# Patient Record
Sex: Female | Born: 1970 | Race: White | Hispanic: No | Marital: Married | Smoking: Never smoker
Health system: Southern US, Community
[De-identification: ages and names within clinical notes are randomized; demographics above are authoritative.]

## PROBLEM LIST (undated history)

## (undated) DIAGNOSIS — M109 Gout, unspecified: Secondary | ICD-10-CM

## (undated) DIAGNOSIS — E559 Vitamin D deficiency, unspecified: Secondary | ICD-10-CM

## (undated) DIAGNOSIS — N870 Mild cervical dysplasia: Secondary | ICD-10-CM

## (undated) DIAGNOSIS — Z9889 Other specified postprocedural states: Secondary | ICD-10-CM

## (undated) DIAGNOSIS — I10 Essential (primary) hypertension: Secondary | ICD-10-CM

## (undated) DIAGNOSIS — K219 Gastro-esophageal reflux disease without esophagitis: Secondary | ICD-10-CM

## (undated) DIAGNOSIS — Z87442 Personal history of urinary calculi: Secondary | ICD-10-CM

## (undated) DIAGNOSIS — T4145XA Adverse effect of unspecified anesthetic, initial encounter: Secondary | ICD-10-CM

## (undated) DIAGNOSIS — N920 Excessive and frequent menstruation with regular cycle: Secondary | ICD-10-CM

## (undated) DIAGNOSIS — R112 Nausea with vomiting, unspecified: Secondary | ICD-10-CM

## (undated) DIAGNOSIS — T8859XA Other complications of anesthesia, initial encounter: Secondary | ICD-10-CM

## (undated) HISTORY — DX: Mild cervical dysplasia: N87.0

## (undated) HISTORY — PX: OTHER SURGICAL HISTORY: SHX169

## (undated) HISTORY — DX: Vitamin D deficiency, unspecified: E55.9

## (undated) HISTORY — DX: Gout, unspecified: M10.9

## (undated) HISTORY — PX: TONSILLECTOMY: SUR1361

## (undated) HISTORY — DX: Excessive and frequent menstruation with regular cycle: N92.0

---
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
## 1995-12-12 HISTORY — PX: CERVICAL BIOPSY  W/ LOOP ELECTRODE EXCISION: SUR135

## 1996-10-09 HISTORY — PX: COLPOSCOPY: SHX161

## 2006-09-06 ENCOUNTER — Ambulatory Visit: Payer: Self-pay | Admitting: Family Medicine

## 2010-03-02 ENCOUNTER — Ambulatory Visit: Payer: Self-pay | Admitting: Family Medicine

## 2011-11-08 HISTORY — PX: DILATION AND CURETTAGE OF UTERUS: SHX78

## 2011-11-24 ENCOUNTER — Ambulatory Visit: Payer: Self-pay | Admitting: Obstetrics and Gynecology

## 2011-11-24 LAB — COMPREHENSIVE METABOLIC PANEL
Albumin: 4 g/dL (ref 3.4–5.0)
Anion Gap: 11 (ref 7–16)
BUN: 11 mg/dL (ref 7–18)
Bilirubin,Total: 1 mg/dL (ref 0.2–1.0)
Chloride: 105 mmol/L (ref 98–107)
Co2: 24 mmol/L (ref 21–32)
Creatinine: 0.72 mg/dL (ref 0.60–1.30)
EGFR (African American): >60
Glucose: 81 mg/dL (ref 65–99)
Potassium: 4.1 mmol/L (ref 3.5–5.1)
SGOT(AST): 18 U/L (ref 15–37)
Total Protein: 8 g/dL (ref 6.4–8.2)

## 2011-11-24 LAB — CBC
MCH: 30.1 pg (ref 26.0–34.0)
MCV: 91 fL (ref 80–100)
Platelet: 362 10*3/uL (ref 150–440)
RBC: 4.15 10*6/uL (ref 3.80–5.20)
RDW: 13.9 % (ref 11.5–14.5)

## 2011-12-02 ENCOUNTER — Ambulatory Visit: Payer: Self-pay | Admitting: Obstetrics and Gynecology

## 2015-03-01 NOTE — Op Note (Signed)
PATIENT NAME:  Amanda Ibarra, Amanda Ibarra MR#:  [id number] DATE OF BIRTH:  Dec 16, 1970  DATE OF PROCEDURE:  12/02/2011  PREOPERATIVE DIAGNOSES:  1. Menorrhagia.  2. Suspected endometrial polyp.   POSTOPERATIVE DIAGNOSES:  1. Menorrhagia. 2. Suspected endometrial polyp.  PROCEDURES:  1. Dilation and curettage.  2. Hysteroscopy.  3. Polypectomy.   SURGEON: Prentice Docker, MD  ANESTHESIA: General.   ESTIMATED BLOOD LOSS: 100 mL.  OPERATIVE FLUIDS: 1000 mL crystalloid.   COMPLICATIONS: None.   FINDINGS:  1. Retroverted uterus, sound to 8 cm.  2. Polypoid lesion, approximately 2 cm in length, base apparently from left sidewall of the uterus.  3. Otherwise normal-appearing uterine cavity.   SPECIMENS: Endometrial curettings and suspected polyp.   CONDITION: Stable.   INDICATIONS FOR PROCEDURE: Amanda Ibarra is a 44 year old female who was seen initially by her provider at [facility] with complaints of very heavy regular periods. In that work-up, which consisted of an ultrasound, it was noted that she had what appeared to be a likely polyp in her endometrium. For this, she was referred to me for potential treatment and diagnosis options. After discussion with the patient and her husband, she elected to undergo a hysteroscopy, and dilation and curettage, and potential removal of polyp as both diagnostic and therapeutic treatment. She was, therefore, taken to the Operating Room.   PROCEDURE IN DETAIL: The patient was met in the preoperative area where the procedure was reviewed and questions were answered. She was taken to the Operating Room and placed under general anesthesia, which was found to be adequate. She was placed in the dorsal supine lithotomy position in candy cane stirrups. She was prepped and draped in the usual sterile fashion. A timeout was called, and a straight catheter was then used with zero urine returned. A sterile speculum was placed in the vagina, and the anterior lip of  the cervix was grasped with a single-tooth tenaculum. The uterus was sounded to a depth of 8 cm. The cervix was then dilated in a serial fashion using Hegar dilators to 8 mm. The hysteroscope was gently introduced through the cervical os until it entered into the endometrial cavity with the above-noted findings. After the hysteroscope was removed, the polyp was attempted to be removed with Randall stone forceps, though unsuccessfully. Next, a gentle curettage of the uterine lining was performed until a gritty texture was noted throughout. The hysteroscope was then reintroduced. There was a fair amount of bleeding, and visualization was poor; so a repeat curettage was performed, and the hysteroscope was then reintroduced with improved visualization and noted absence of the aforementioned polyp. At this point, the procedure was terminated. The hysteroscope was removed from the uterus. The tenaculum was removed from the anterior lip of the cervix and hemostasis was obtained. The speculum was removed from the vagina; and, prior to this, the vagina was verified to be free of any sponges or instruments.   The patient tolerated the procedure well. Sponge, lap, and instrument counts were correct x2. The patient was awakened in the Operating Room and taken to the PACU in stable condition.   ____________________________ Will Bonnet, MD sdj:cbb D: 12/02/2011 17:46:48 ET T: 12/03/2011 08:25:33 ET JOB#: [id number]  cc: Will Bonnet, MD, <Dictator> Will Bonnet MD ELECTRONICALLY SIGNED 12/18/2011 3:05

## 2015-03-30 ENCOUNTER — Emergency Department
Admission: EM | Admit: 2015-03-30 | Discharge: 2015-03-30 | Disposition: A | Discharge status: Home / Self Care | Payer: BLUE CROSS/BLUE SHIELD | Attending: Emergency Medicine | Admitting: Emergency Medicine

## 2015-03-30 ENCOUNTER — Emergency Department: Payer: BLUE CROSS/BLUE SHIELD

## 2015-03-30 DIAGNOSIS — M62830 Muscle spasm of back: Secondary | ICD-10-CM | POA: Insufficient documentation

## 2015-03-30 DIAGNOSIS — Z880 Allergy status to penicillin: Secondary | ICD-10-CM | POA: Insufficient documentation

## 2015-03-30 DIAGNOSIS — Z3202 Encounter for pregnancy test, result negative: Secondary | ICD-10-CM | POA: Diagnosis not present

## 2015-03-30 DIAGNOSIS — Z79899 Other long term (current) drug therapy: Secondary | ICD-10-CM | POA: Diagnosis not present

## 2015-03-30 DIAGNOSIS — N23 Unspecified renal colic: Secondary | ICD-10-CM | POA: Insufficient documentation

## 2015-03-30 DIAGNOSIS — R109 Unspecified abdominal pain: Secondary | ICD-10-CM | POA: Diagnosis present

## 2015-03-30 DIAGNOSIS — I10 Essential (primary) hypertension: Secondary | ICD-10-CM | POA: Diagnosis not present

## 2015-03-30 HISTORY — DX: Essential (primary) hypertension: I10

## 2015-03-30 LAB — URINALYSIS COMPLETE WITH MICROSCOPIC (ARMC ONLY)
Bacteria, UA: NONE SEEN
Bilirubin Urine: NEGATIVE
Glucose, UA: NEGATIVE mg/dL
Ketones, ur: NEGATIVE mg/dL
LEUKOCYTES UA: NEGATIVE
Nitrite: NEGATIVE
PH: 6 (ref 5.0–8.0)
Protein, ur: NEGATIVE mg/dL
Specific Gravity, Urine: 1.013 (ref 1.005–1.030)

## 2015-03-30 LAB — CBC WITH DIFFERENTIAL/PLATELET
BASOS PCT: 1 %
Basophils Absolute: 0.1 10*3/uL (ref 0–0.1)
EOS ABS: 0 10*3/uL (ref 0–0.7)
Eosinophils Relative: 0 %
HEMATOCRIT: 45.1 % (ref 35.0–47.0)
Hemoglobin: 15.1 g/dL (ref 12.0–16.0)
LYMPHS ABS: 1.7 10*3/uL (ref 1.0–3.6)
LYMPHS PCT: 16 %
MCH: 30.3 pg (ref 26.0–34.0)
MCHC: 33.5 g/dL (ref 32.0–36.0)
MCV: 90.5 fL (ref 80.0–100.0)
MONO ABS: 0.4 10*3/uL (ref 0.2–0.9)
Monocytes Relative: 4 %
NEUTROS ABS: 8.4 10*3/uL — AB (ref 1.4–6.5)
NEUTROS PCT: 79 %
Platelets: 327 10*3/uL (ref 150–440)
RBC: 4.99 MIL/uL (ref 3.80–5.20)
RDW: 12.9 % (ref 11.5–14.5)
WBC: 10.6 10*3/uL (ref 3.6–11.0)

## 2015-03-30 LAB — BASIC METABOLIC PANEL
ANION GAP: 12 (ref 5–15)
BUN: 11 mg/dL (ref 6–20)
CO2: 22 mmol/L (ref 22–32)
Calcium: 9.6 mg/dL (ref 8.9–10.3)
Chloride: 102 mmol/L (ref 101–111)
Creatinine, Ser: 0.77 mg/dL (ref 0.44–1.00)
GFR calc Af Amer: >60 mL/min (ref >60)
GFR calc non Af Amer: >60 mL/min (ref >60)
Glucose, Bld: 107 mg/dL — ABNORMAL HIGH (ref 65–99)
POTASSIUM: 3.8 mmol/L (ref 3.5–5.1)
Sodium: 136 mmol/L (ref 135–145)

## 2015-03-30 LAB — POCT PREGNANCY, URINE: Preg Test, Ur: NEGATIVE

## 2015-03-30 MED ORDER — OXYCODONE-ACETAMINOPHEN 5-325 MG PO TABS
1.0000 | ORAL_TABLET | Freq: Once | ORAL | Status: AC
Start: 2015-03-30 — End: 2015-03-30
  Administered 2015-03-30: 1 via ORAL

## 2015-03-30 MED ORDER — OXYCODONE-ACETAMINOPHEN 5-325 MG PO TABS
ORAL_TABLET | ORAL | Status: AC
  Administered 2015-03-30: 1 via ORAL
  Filled 2015-03-30: qty 1

## 2015-03-30 MED ORDER — KETOROLAC TROMETHAMINE 30 MG/ML IJ SOLN
INTRAMUSCULAR | Status: AC
  Filled 2015-03-30: qty 1

## 2015-03-30 MED ORDER — OXYCODONE-ACETAMINOPHEN 5-325 MG PO TABS: 1.0000 | ORAL_TABLET | Freq: Four times a day (QID) | ORAL | Status: DC | PRN

## 2015-03-30 MED ORDER — OXYCODONE-ACETAMINOPHEN 5-325 MG PO TABS
1.0000 | ORAL_TABLET | Freq: Once | ORAL | Status: AC
  Administered 2015-03-30: 1 via ORAL

## 2015-03-30 MED ORDER — OXYCODONE-ACETAMINOPHEN 5-325 MG PO TABS
ORAL_TABLET | ORAL | Status: AC
  Filled 2015-03-30: qty 1

## 2015-03-30 MED ORDER — KETOROLAC TROMETHAMINE 30 MG/ML IJ SOLN: 30.0000 mg | Freq: Once | INTRAMUSCULAR | Status: DC

## 2015-03-30 MED ORDER — KETOROLAC TROMETHAMINE 60 MG/2ML IM SOLN
60.0000 mg | Freq: Once | INTRAMUSCULAR | Status: AC
  Administered 2015-03-30: 60 mg via INTRAMUSCULAR

## 2015-03-30 MED ORDER — KETOROLAC TROMETHAMINE 60 MG/2ML IM SOLN
INTRAMUSCULAR | Status: AC
  Administered 2015-03-30: 60 mg via INTRAMUSCULAR
  Filled 2015-03-30: qty 2

## 2015-03-30 MED ORDER — KETOROLAC TROMETHAMINE 30 MG/ML IJ SOLN
15.0000 mg | Freq: Once | INTRAMUSCULAR | Status: AC
  Administered 2015-03-30: 15 mg via INTRAMUSCULAR

## 2015-03-30 NOTE — Discharge Instructions (Signed)

## 2015-03-30 NOTE — ED Provider Notes (Signed)
[facility] Emergency Department Provider Note  ____________________________________________  Time seen: 1455  I have reviewed the triage vital signs and the nursing notes.   HISTORY  Chief Complaint Flank Pain  left side    HPI Amanda Ibarra is a 44 y.o. female  who experienced acute onset flank pain on the left this morning. It occurred well she was driving. The pain has been moderately severe. She does not have a history of prior kidney stones but she is concerned that that may be the cause. Her significant other has had 6 kidney stones. She came to the emergency Department and was given a Percocet while waiting. This helped reduce the pain but it is now coming back. She denies any fevers. She has no diarrhea, nausea or vomiting, or other GI symptoms.     Past Medical History  Diagnosis Date  . Hypertension     There are no active problems to display for this patient.   Past Surgical History  Procedure Laterality Date  . Tonsillectomy      Current Outpatient Rx  Name  Route  Sig  Dispense  Refill  . calcium-vitamin D (OSCAL WITH D) 500-200 MG-UNIT per tablet   Oral   Take 1 tablet by mouth.         . metoprolol succinate (TOPROL-XL) 25 MG 24 hr tablet   Oral   Take 25 mg by mouth daily.         Marland Kitchen oxyCODONE-acetaminophen (ROXICET) 5-325 MG per tablet   Oral   Take 1 tablet by mouth every 6 (six) hours as needed.   20 tablet   0     Allergies Codeine and Penicillins  No family history on file.  Social History History  Substance Use Topics  . Smoking status: Never Smoker   . Smokeless tobacco: Never Used  . Alcohol Use: No    Review of Systems  Constitutional: Negative for fever. ENT: Negative for sore throat. Cardiovascular: Negative for chest pain. Respiratory: Negative for shortness of breath. Gastrointestinal: Flank pain see history of present illness Genitourinary: Flank pain see history of present illness  Musculoskeletal: Negative for back pain. Skin: Negative for rash. Neurological: Negative for headaches   10-point ROS otherwise negative.  ____________________________________________   PHYSICAL EXAM:  VITAL SIGNS: ED Triage Vitals  Enc Vitals Group     BP 03/30/15 1222 139/85 mmHg     Pulse Rate 03/30/15 1222 80     Resp 03/30/15 1222 18     Temp 03/30/15 1222 98.6 F (37 C)     Temp Source 03/30/15 1222 Oral     SpO2 03/30/15 1222 100 %     Weight 03/30/15 1222 239 lb (108.41 kg)     Height 03/30/15 1222 5\' 8"  (1.727 m)     Head Cir --      Peak Flow --      Pain Score 03/30/15 1223 6     Pain Loc --      Pain Edu? --      Excl. in [place]? --     Constitutional:  Alert and oriented. Appears to be moderately uncomfortable with left-sided flank pain. ENT   Head: Normocephalic and atraumatic.   Nose: No congestion/rhinnorhea.   Mouth/Throat: Mucous membranes are moist.      Ears: Cardiovascular: Normal rate, regular rhythm. Respiratory: Normal respiratory effort without tachypnea. Breath sounds are clear and equal bilaterally. No wheezes/rales/rhonchi. Gastrointestinal: Soft with mild tenderness on the left.  Patient and reports that it is really matter if one pushes on her belly or not it continues to hurt.  Back: Some degree of protective muscle spasm on the left. Positive CVA tenderness on the left Musculoskeletal: Nontender with normal range of motion in all extremities.  No noted edema. Neurologic:  Normal speech and language. No gross focal neurologic deficits are appreciated.  Skin:  Skin is warm, dry. No rash noted. Psychiatric: Mood and affect are normal. Speech and behavior are normal.  ____________________________________________    LABS (pertinent positives/negatives)  Urinalysis shows red blood cells too numerous to count. Metabolic panel is normal with normal kidney function. CBC is normal with a white blood cell count of  10.6. ____________________________________________   RADIOLOGY  Ultrasound, renal:  IMPRESSION: 1. Mild left-sided hydronephrosis. No ureteral jet is demonstrated. 2. No right-sided hydronephrosis. There is a 2.2 cm diameter lower pole cyst. 3. No bladder abnormality is demonstrated.  ____________________________________________  ______________________________   INITIAL IMPRESSION / ASSESSMENT AND PLAN / ED COURSE  Patient was signs and symptoms of a ureter stone. We will get an ultrasound to confirm. We discussed the pros and cons of CT scan to evaluate. The patient does not want a CT scan of this time. We'll treat her pain with additional Percocet now and Toradol 60 mg IM.  1755: Ultrasound does show left hydroureter consistent with a renal stone. Patient is overall comfortable this time. We'll discharge her with prescription for Percocet and instructions to follow-up with urology. ____________________________________________   FINAL CLINICAL IMPRESSION(S) / ED DIAGNOSES  Final diagnoses:  Renal colic on left side      Ahmed Prima, MD 03/30/15 1757

## 2015-03-30 NOTE — ED Notes (Signed)
Pt c/o sudden onset left flank pain that started at 8am this morning .   Denies kidney stone hx

## 2015-04-03 LAB — TSH: TSH: 2.65 u[IU]/mL (ref 0.41–5.90)

## 2015-04-03 LAB — CBC AND DIFFERENTIAL
HCT: 41 % (ref 36–46)
HEMOGLOBIN: 13.8 g/dL (ref 12.0–16.0)
PLATELETS: 356 10*3/uL (ref 150–399)
WBC: 8.9 10^3/mL

## 2015-04-03 LAB — BASIC METABOLIC PANEL
BUN: 13 mg/dL (ref 4–21)
Creatinine: 1 mg/dL (ref 0.5–1.1)
GLUCOSE: 91 mg/dL
Potassium: 4.1 mmol/L (ref 3.4–5.3)
SODIUM: 140 mmol/L (ref 137–147)

## 2015-04-03 LAB — HEPATIC FUNCTION PANEL
ALT: 22 U/L (ref 7–35)
AST: 20 U/L (ref 13–35)

## 2015-04-10 ENCOUNTER — Ambulatory Visit: Payer: Self-pay | Admitting: Urology

## 2015-04-10 DIAGNOSIS — F4320 Adjustment disorder, unspecified: Secondary | ICD-10-CM | POA: Insufficient documentation

## 2015-04-10 DIAGNOSIS — N3289 Other specified disorders of bladder: Secondary | ICD-10-CM | POA: Insufficient documentation

## 2015-04-10 DIAGNOSIS — G44209 Tension-type headache, unspecified, not intractable: Secondary | ICD-10-CM | POA: Insufficient documentation

## 2015-04-10 DIAGNOSIS — I10 Essential (primary) hypertension: Secondary | ICD-10-CM | POA: Insufficient documentation

## 2015-04-10 DIAGNOSIS — E669 Obesity, unspecified: Secondary | ICD-10-CM | POA: Insufficient documentation

## 2015-04-10 DIAGNOSIS — J309 Allergic rhinitis, unspecified: Secondary | ICD-10-CM | POA: Insufficient documentation

## 2015-04-10 DIAGNOSIS — F419 Anxiety disorder, unspecified: Secondary | ICD-10-CM | POA: Insufficient documentation

## 2015-04-10 DIAGNOSIS — E559 Vitamin D deficiency, unspecified: Secondary | ICD-10-CM | POA: Insufficient documentation

## 2015-04-10 DIAGNOSIS — N1330 Unspecified hydronephrosis: Secondary | ICD-10-CM | POA: Insufficient documentation

## 2015-04-21 ENCOUNTER — Other Ambulatory Visit: Payer: Self-pay | Admitting: Family Medicine

## 2015-04-21 DIAGNOSIS — N200 Calculus of kidney: Secondary | ICD-10-CM

## 2015-04-21 MED ORDER — KETOROLAC TROMETHAMINE 10 MG PO TABS: 10.0000 mg | ORAL_TABLET | Freq: Four times a day (QID) | ORAL | Status: DC | PRN

## 2015-04-21 NOTE — Telephone Encounter (Signed)
Mr. Arnall advised as directed below.   Thanks,   -Mickel Baas

## 2015-04-21 NOTE — Telephone Encounter (Signed)
Sent rx. Please call if worsens or does not improve.  Make sure patient drinks plenty of fluids. Thanks.

## 2015-04-21 NOTE — Telephone Encounter (Signed)
Pt husband, Delfino Lovett called states pt is having a lot of pain and seems to have a kidney stone moving.  Pt husband is requesting a Rx for Ketorolac tromethamine.  [facility] [address].  [phone]

## 2015-05-07 ENCOUNTER — Encounter: Payer: Self-pay | Admitting: Family Medicine

## 2015-05-07 ENCOUNTER — Ambulatory Visit (INDEPENDENT_AMBULATORY_CARE_PROVIDER_SITE_OTHER): Payer: BLUE CROSS/BLUE SHIELD | Admitting: Family Medicine

## 2015-05-07 VITALS — BP 146/88 | HR 88 | Temp 98.3°F | Resp 16 | Ht 68.0 in | Wt 239.0 lb

## 2015-05-07 DIAGNOSIS — N1330 Unspecified hydronephrosis: Secondary | ICD-10-CM

## 2015-05-07 DIAGNOSIS — N200 Calculus of kidney: Secondary | ICD-10-CM | POA: Diagnosis not present

## 2015-05-07 LAB — POCT URINALYSIS DIPSTICK
Bilirubin, UA: NEGATIVE
Glucose, UA: NEGATIVE
KETONES UA: NEGATIVE
LEUKOCYTES UA: NEGATIVE
Nitrite, UA: NEGATIVE
PH UA: 7
Protein, UA: NEGATIVE
Spec Grav, UA: <=1.005
Urobilinogen, UA: 0.2

## 2015-05-07 NOTE — Progress Notes (Signed)
Subjective:    Patient ID: Amanda Ibarra, female    DOB: 05/20/1971, 44 y.o.   MRN: [id number]  HPI   Pt presents today for a follow up of kidney stones.  Pt was in the ED 03/30/2015 for flank pain and was diagnosed with a Kidney Stone.  She was prescribed Toradol, and Flomax.  She reports feeling much better now and is not having any pain or urinary symptoms.  She has stopped the Toradol, but is still taking Flomax daily.  Does not know if passed the stone. Did have a pressure time and went away.    Patient Active Problem List   Diagnosis Date Noted  . Kidney stones 05/07/2015  . 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 Allergic rhinitis 04/10/2015  . Anxiety 04/10/2015  . Hydronephrosis 04/10/2015  . BP (high blood pressure) 04/10/2015  . Adiposity 04/10/2015  . 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 Tension type headache 04/10/2015  . Avitaminosis D 04/10/2015   Family History  Problem Relation Age of Onset  . Bone cancer Maternal Grandmother   . Hypertension Father   . Heart attack Maternal Grandfather   . Gout Maternal Grandfather   . Arthritis/Rheumatoid Mother    History   Social History  . Marital Status: Married    Spouse Name: N/A  . Number of Children: 1  . Years of Education: Bachelors   Occupational History  .  [employer]    Full Time   Social History Main Topics  . Smoking status: Never Smoker   . Smokeless tobacco: Never Used  . Alcohol Use: No  . Drug Use: No  . Sexual Activity: Yes    Birth Control/ Protection: IUD   Other Topics Concern  . Not on file   Social History Narrative   Past Surgical History  Procedure Laterality Date  . Tonsillectomy    . Dilation and curettage of uterus  11/2011  . [place] teeth     Allergies  Allergen Reactions  . Azithromycin Other (See Comments)    Other reaction(s): Vomiting  . Codeine Other (See Comments)    unknown  . Penicillins Rash   Previous Medications   CHOLECALCIFEROL (VITAMIN D) 2000  UNITS TABLET    Take 2,000 Units by mouth daily.   KETOROLAC (TORADOL) 10 MG TABLET    Take 1 tablet (10 mg total) by mouth every 6 (six) hours as needed.   METOPROLOL SUCCINATE (TOPROL-XL) 25 MG 24 HR TABLET    Take 25 mg by mouth daily.   MULTIPLE VITAMIN PO    Take 1 tablet by mouth daily.   OXYCODONE-ACETAMINOPHEN (ROXICET) 5-325 MG PER TABLET    Take 1 tablet by mouth every 6 (six) hours as needed.   TAMSULOSIN (FLOMAX) 0.4 MG CAPS CAPSULE    TK 1 C PO D FOR 14 DAYS   BP 146/88 mmHg  Pulse 88  Temp(Src) 98.3 F (36.8 C) (Oral)  Resp 16  Ht 5\' 8"  (1.727 m)  Wt 239 lb (108.41 kg)  BMI 36.35 kg/m2   Review of Systems  Constitutional: Negative for chills, diaphoresis, activity change, appetite change, fatigue and unexpected weight change.  Gastrointestinal: Negative for nausea, vomiting, diarrhea, constipation, blood in stool, abdominal distention, anal bleeding and rectal pain.  Genitourinary: Negative for urgency, frequency, hematuria, decreased urine volume, vaginal bleeding, vaginal discharge, enuresis, difficulty urinating, genital sores, vaginal pain, menstrual problem and dyspareunia.   BP 146/88 mmHg  Pulse 88  Temp(Src) 98.3 F (36.8 C) (Oral)  Resp 16  Ht 5\' 8"  (1.727 m)  Wt 239 lb (108.41 kg)  BMI 36.35 kg/m2      Objective:   Physical Exam  Constitutional: She is oriented to person, place, and time. She appears well-developed and well-nourished.  Neurological: She is alert and oriented to person, place, and time.  Psychiatric: She has a normal mood and affect. Her behavior is normal. Judgment and thought content normal.          Assessment & Plan:   1. Kidney stones Still with small blood, will send for UA.  Establish care with Dr. Jacqlyn Larsen on Friday.   - POCT urinalysis dipstick - US Renal; Future  2. Hydronephrosis, unspecified hydronephrosis type Will schedule follow up ultrasound.  - US Renal; Future

## 2015-05-08 ENCOUNTER — Telehealth: Payer: Self-pay

## 2015-05-08 LAB — URINALYSIS, ROUTINE W REFLEX MICROSCOPIC
BILIRUBIN UA: NEGATIVE
GLUCOSE, UA: NEGATIVE
Ketones, UA: NEGATIVE
Leukocytes, UA: NEGATIVE
Nitrite, UA: NEGATIVE
PH UA: 6.5 (ref 5.0–7.5)
Protein, UA: NEGATIVE
RBC UA: NEGATIVE
Specific Gravity, UA: 1.009 (ref 1.005–1.030)
UUROB: 0.2 mg/dL (ref 0.2–1.0)

## 2015-05-08 NOTE — Telephone Encounter (Signed)
Advised pt of lab results. Pt verbally acknowledges understanding. Jeslyn Amsler Drozdowski, CMA   

## 2015-05-08 NOTE — Telephone Encounter (Signed)
-----   Message from Margarita Rana, MD sent at 05/08/2015  6:47 AM EDT ----- Urine ok. Thanks.

## 2015-05-12 ENCOUNTER — Ambulatory Visit
Admission: RE | Admit: 2015-05-12 | Discharge: 2015-05-12 | Disposition: A | Discharge status: Home / Self Care | Payer: BLUE CROSS/BLUE SHIELD | Source: Ambulatory Visit | Attending: Family Medicine | Admitting: Family Medicine

## 2015-05-12 DIAGNOSIS — N281 Cyst of kidney, acquired: Secondary | ICD-10-CM | POA: Insufficient documentation

## 2015-05-12 DIAGNOSIS — N1330 Unspecified hydronephrosis: Secondary | ICD-10-CM | POA: Diagnosis present

## 2015-05-12 DIAGNOSIS — N200 Calculus of kidney: Secondary | ICD-10-CM

## 2015-05-13 ENCOUNTER — Telehealth: Payer: Self-pay

## 2015-05-13 NOTE — Telephone Encounter (Signed)
Informed pt of results. Pt agrees with treatment plan. Renaldo Fiddler, CMA

## 2015-05-13 NOTE — Telephone Encounter (Signed)
-----   Message from Margarita Rana, MD sent at 05/12/2015  5:25 PM EDT ----- Ultrasound normal. No hydronephrosis. Resolved. Does have small renal cyst. Usually benign but would recommend recheck ultrasound in one year just to be on safe side. So small, radiologist did not even recommend follow up. Thanks.

## 2015-09-18 ENCOUNTER — Other Ambulatory Visit: Payer: Self-pay | Admitting: Family Medicine

## 2015-09-18 DIAGNOSIS — I10 Essential (primary) hypertension: Secondary | ICD-10-CM

## 2015-11-10 DIAGNOSIS — Z87442 Personal history of urinary calculi: Secondary | ICD-10-CM | POA: Insufficient documentation

## 2016-01-08 ENCOUNTER — Telehealth: Payer: Self-pay | Admitting: Family Medicine

## 2016-01-08 NOTE — Telephone Encounter (Signed)
Husband requested patch for cruise, but not until May, so will call back. Thanks.

## 2016-01-18 ENCOUNTER — Encounter: Payer: Self-pay | Admitting: Family Medicine

## 2016-01-18 ENCOUNTER — Ambulatory Visit (INDEPENDENT_AMBULATORY_CARE_PROVIDER_SITE_OTHER): Payer: BLUE CROSS/BLUE SHIELD | Admitting: Family Medicine

## 2016-01-18 VITALS — BP 146/80 | HR 96 | Temp 99.0°F | Resp 16 | Ht 68.0 in | Wt 232.0 lb

## 2016-01-18 DIAGNOSIS — I10 Essential (primary) hypertension: Secondary | ICD-10-CM

## 2016-01-18 DIAGNOSIS — N3090 Cystitis, unspecified without hematuria: Secondary | ICD-10-CM

## 2016-01-18 LAB — POCT URINALYSIS DIPSTICK
Bilirubin, UA: NEGATIVE
Glucose, UA: NEGATIVE
KETONES UA: NEGATIVE
PH UA: 5
SPEC GRAV UA: 1.025
Urobilinogen, UA: 1

## 2016-01-18 MED ORDER — NITROFURANTOIN MONOHYD MACRO 100 MG PO CAPS: 100.0000 mg | ORAL_CAPSULE | Freq: Two times a day (BID) | ORAL | Status: DC

## 2016-01-18 NOTE — Progress Notes (Signed)
Patient ID: Amanda Ibarra, female   DOB: [birth year], 45 y.o.   MRN: [id number]         Patient: Amanda Ibarra Female    DOB: [birth year]   45 y.o.   MRN: [id number] Visit Date: 01/18/2016  Today's Provider: Margarita Rana, MD   Chief Complaint  Patient presents with  . Cystitis   Subjective:    Urinary Tract Infection  This is a new problem. The current episode started in the past 7 days. The problem occurs every urination. The problem has been waxing and waning. The quality of the pain is described as burning (Pressure). There has been no fever. Associated symptoms include chills, frequency and urgency. Pertinent negatives include no hematuria, nausea, sweats or vomiting. She has tried acetaminophen and increased fluids for the symptoms. The treatment provided no relief. Her past medical history is significant for kidney stones.       Allergies  Allergen Reactions  . Azithromycin Other (See Comments)    Other reaction(s): Vomiting  . Codeine Other (See Comments)    unknown  . Penicillins Rash   Previous Medications   CHOLECALCIFEROL (VITAMIN D) 2000 UNITS TABLET    Take 2,000 Units by mouth daily.   LEVONORGESTREL (MIRENA) 20 MCG/24HR IUD    by Intrauterine route.   METOPROLOL SUCCINATE (TOPROL-XL) 25 MG 24 HR TABLET    TAKE 1 TABLET BY MOUTH EVERY MORNING   MULTIPLE VITAMIN PO    Take 1 tablet by mouth daily.    Review of Systems  Constitutional: Positive for chills and fatigue. Negative for fever, diaphoresis, activity change, appetite change and unexpected weight change.  Respiratory: Negative.   Cardiovascular: Negative.   Gastrointestinal: Negative.  Negative for nausea and vomiting.  Genitourinary: Positive for dysuria, urgency, frequency, decreased urine volume and difficulty urinating. Negative for hematuria, vaginal bleeding, vaginal discharge, genital sores, vaginal pain, menstrual problem, pelvic pain and dyspareunia.  Musculoskeletal: Positive for back pain.      Social History  Substance Use Topics  . Smoking status: Never Smoker   . Smokeless tobacco: Never Used  . Alcohol Use: No   Objective:   BP 146/80 mmHg  Pulse 96  Temp(Src) 99 F (37.2 C) (Oral)  Resp 16  Ht 5\' 8"  (1.727 m)  Wt 232 lb (105.235 kg)  BMI 35.28 kg/m2  LMP 01/12/2016 (Exact Date) Physical Exam  Constitutional: She is oriented to person, place, and time. She appears well-developed and well-nourished. No distress.  Cardiovascular: Normal rate.   Pulmonary/Chest: Breath sounds normal.  Abdominal: She exhibits no distension and no mass. There is tenderness in the suprapubic area. There is no rebound, no guarding and no CVA tenderness.  Neurological: She is alert and oriented to person, place, and time.  Psychiatric: She has a normal mood and affect. Her behavior is normal.      Assessment & Plan:     1. Cystitis New problem Condition is worsening. Will start medication for better control.   Patient instructed to call back if condition worsens or does not improve.    Will send for culture.   - POCT urinalysis dipstick - nitrofurantoin, macrocrystal-monohydrate, (MACROBID) 100 MG capsule; Take 1 capsule (100 mg total) by mouth 2 (two) times daily.  Dispense: 14 capsule; Refill: 0 - Urine Culture - Urinalysis, Routine w reflex microscopic   2. Essential hypertension Slightly elevated today. Will monitor more closely. Will need to add another BP medication if consistently over 140/90.  Patient was seen and  examined by Jerrell Belfast, MD, and note scribed by Ashley Royalty, CMA.   I have reviewed the document for accuracy and completeness and I agree with above. - Jerrell Belfast, MD      Margarita Rana, MD  [facility]

## 2016-01-19 LAB — URINALYSIS, ROUTINE W REFLEX MICROSCOPIC
BILIRUBIN UA: NEGATIVE
Glucose, UA: NEGATIVE
Ketones, UA: NEGATIVE
Nitrite, UA: POSITIVE — AB
Specific Gravity, UA: 1.023 (ref 1.005–1.030)
UUROB: 1 mg/dL (ref 0.2–1.0)
pH, UA: 5.5 (ref 5.0–7.5)

## 2016-01-19 LAB — MICROSCOPIC EXAMINATION

## 2016-01-20 LAB — URINE CULTURE

## 2016-01-21 ENCOUNTER — Telehealth: Payer: Self-pay

## 2016-01-21 NOTE — Telephone Encounter (Signed)
-----   Message from Margarita Rana, MD sent at 01/20/2016  3:02 PM EDT ----- Does have infection. Treated.  Please notify patient. Thanks.

## 2016-01-21 NOTE — Telephone Encounter (Signed)
Pt advised she reports feeling much better.   Thanks,   -Laura  

## 2016-02-17 DIAGNOSIS — Z01419 Encounter for gynecological examination (general) (routine) without abnormal findings: Secondary | ICD-10-CM | POA: Diagnosis not present

## 2016-02-17 DIAGNOSIS — Z124 Encounter for screening for malignant neoplasm of cervix: Secondary | ICD-10-CM | POA: Diagnosis not present

## 2016-02-17 DIAGNOSIS — Z1231 Encounter for screening mammogram for malignant neoplasm of breast: Secondary | ICD-10-CM | POA: Diagnosis not present

## 2016-02-18 ENCOUNTER — Other Ambulatory Visit: Payer: Self-pay | Admitting: Family Medicine

## 2016-02-18 DIAGNOSIS — I10 Essential (primary) hypertension: Secondary | ICD-10-CM

## 2016-02-19 ENCOUNTER — Telehealth: Payer: Self-pay

## 2016-02-19 DIAGNOSIS — T753XXA Motion sickness, initial encounter: Secondary | ICD-10-CM

## 2016-02-19 NOTE — Telephone Encounter (Signed)
Husband Richard, who is also your patient,  Called an stats that he had discussed with you about patient, husband and their son going on a 7 day cruise and that you would give them sea sickness patches. Needs RX for the patient and for their son [name] 07/04/1997-he is a patient here too. Dorthula Nettles is [phone] if you have any questions. Pharmacy is [facility]

## 2016-02-19 NOTE — Telephone Encounter (Signed)
Ok to put in rx for scopalamine  Patches for me to sign for all three family members. Thanks.

## 2016-02-22 MED ORDER — SCOPOLAMINE 1 MG/3DAYS TD PT72: 1.0000 | MEDICATED_PATCH | TRANSDERMAL | Status: DC

## 2016-03-17 ENCOUNTER — Encounter: Payer: Self-pay | Admitting: Family Medicine

## 2016-06-11 IMAGING — US US RENAL
1 series · 14 of 25 positions shown · non-contrast
Comparison: Abdominal ultrasound March 02, 2010

CLINICAL DATA: Onset of left flank pain earlier today, microscopic
hematuria.

EXAM:
RENAL / URINARY TRACT ULTRASOUND COMPLETE

[Series 1: us renal · 0.24mm/px · 14 of 38 slices shown]
[im 1/38]
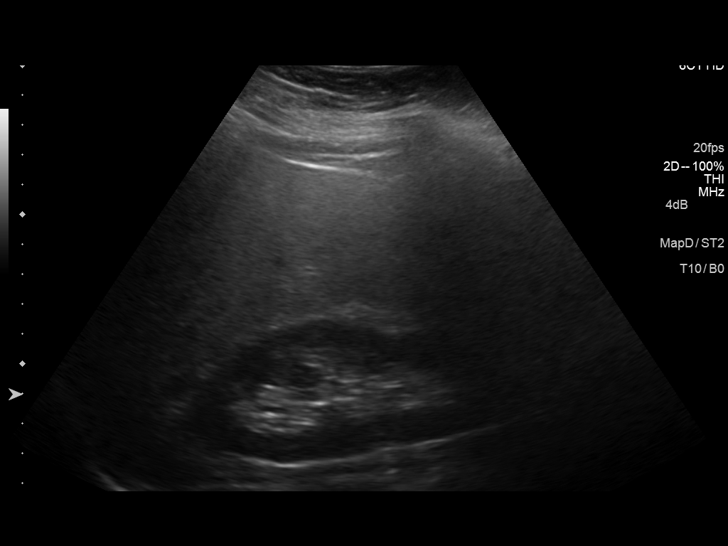
[im 4/38]
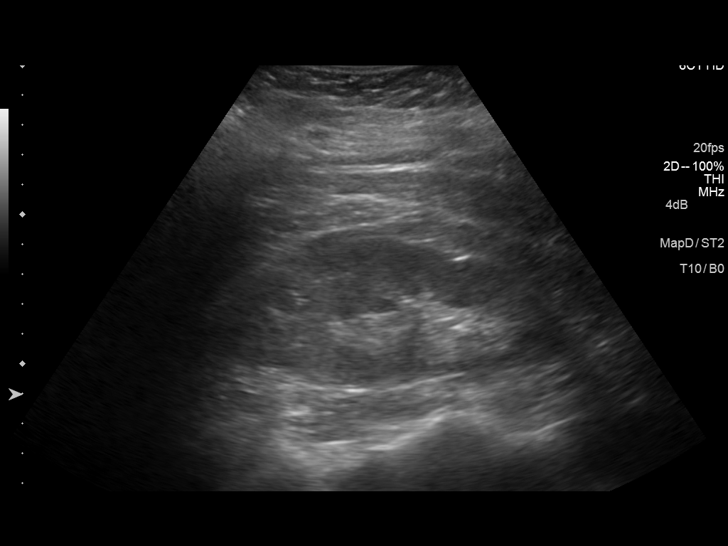
[im 7/38]
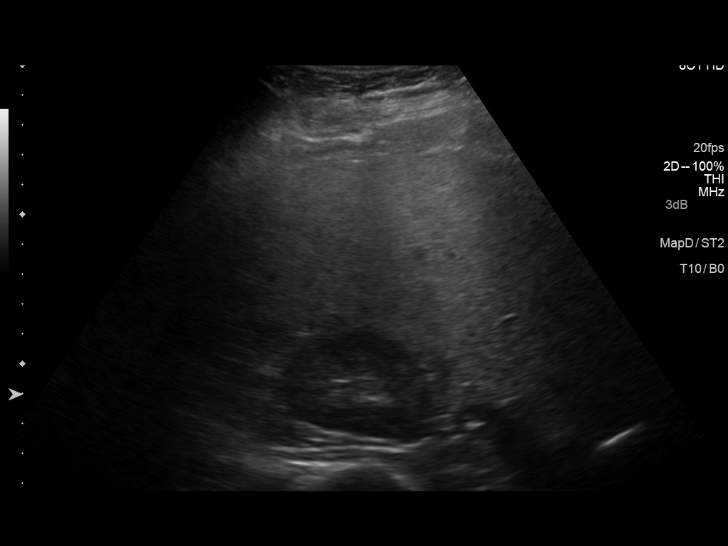
[im 10/38]
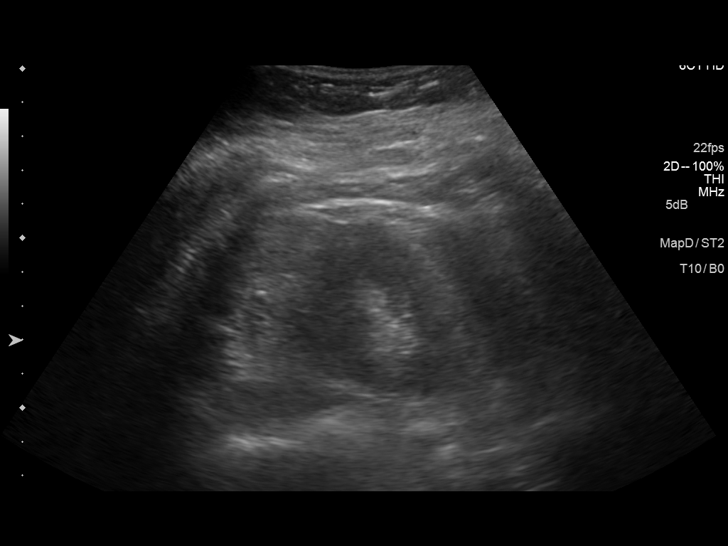
[im 13/38]
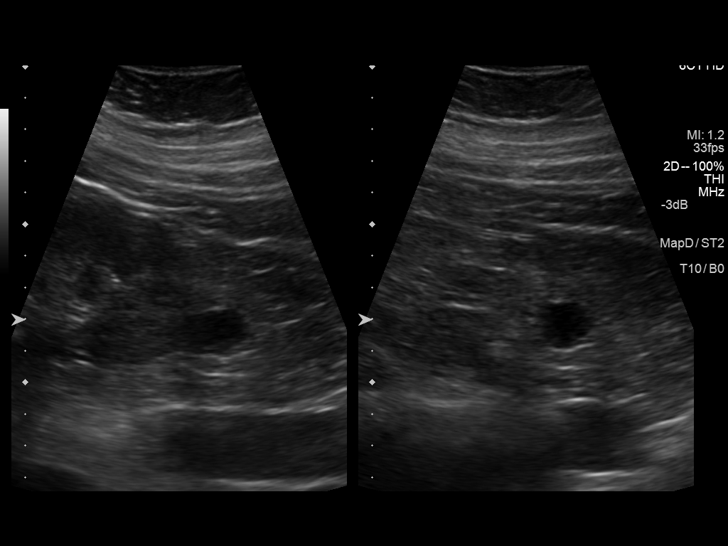
[im 14/38]
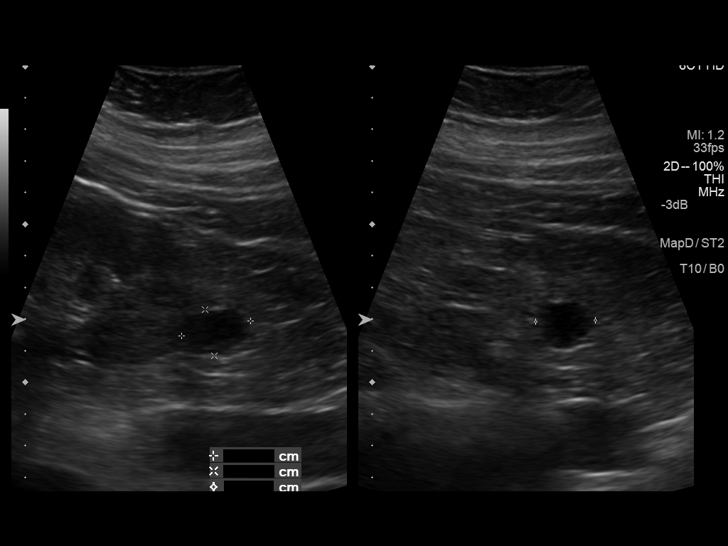
[im 17/38]
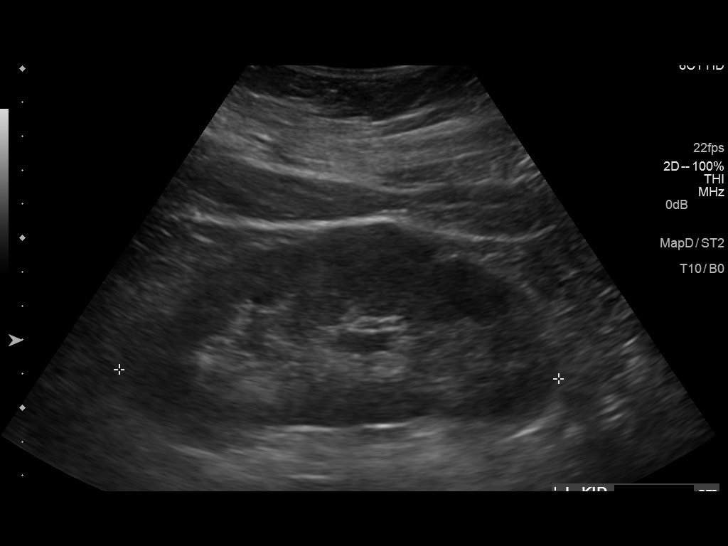
[im 21/38]
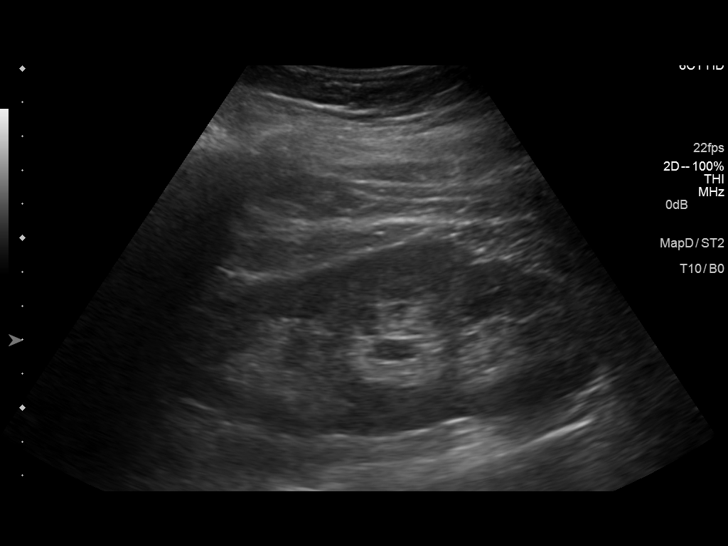
[im 24/38]
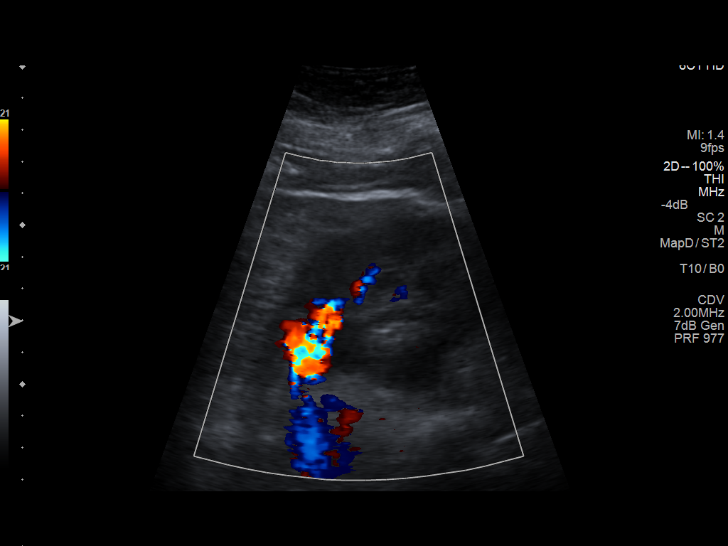
[im 25/38]
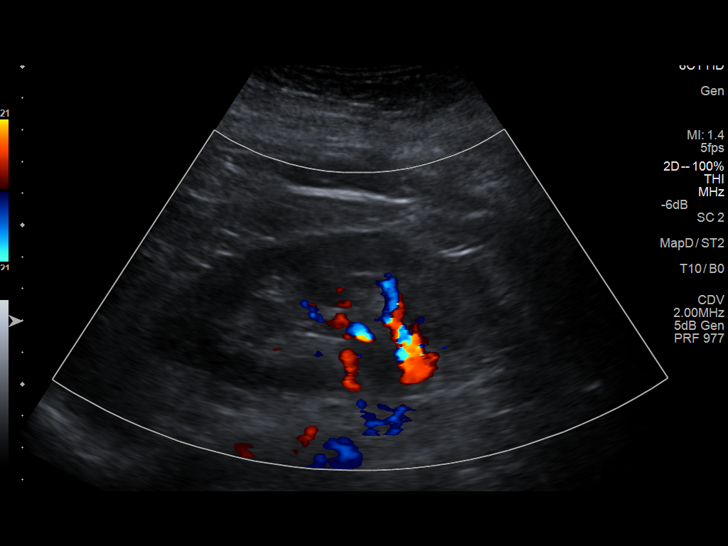
[im 28/38]
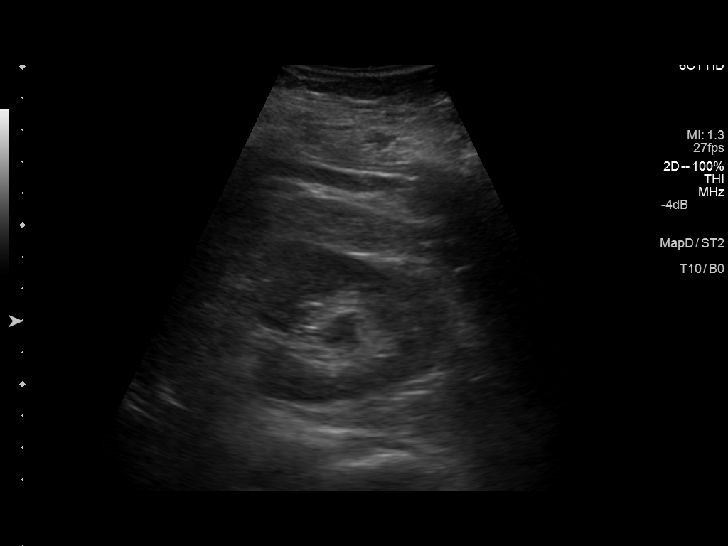
[im 31/38]
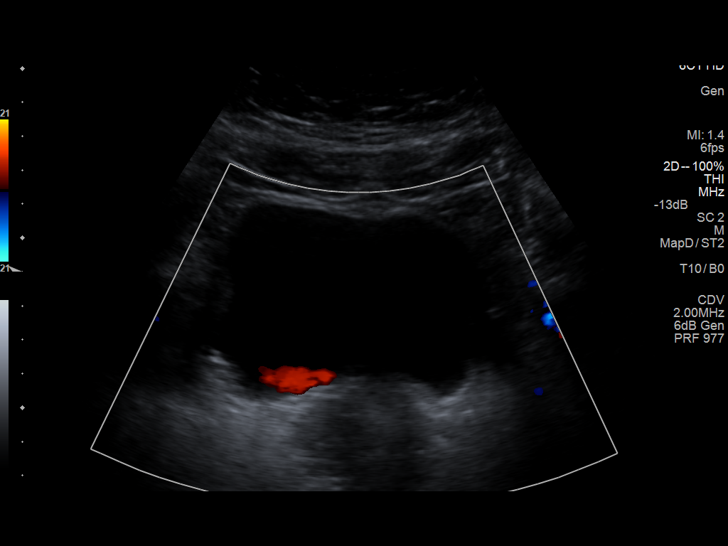
[im 34/38]
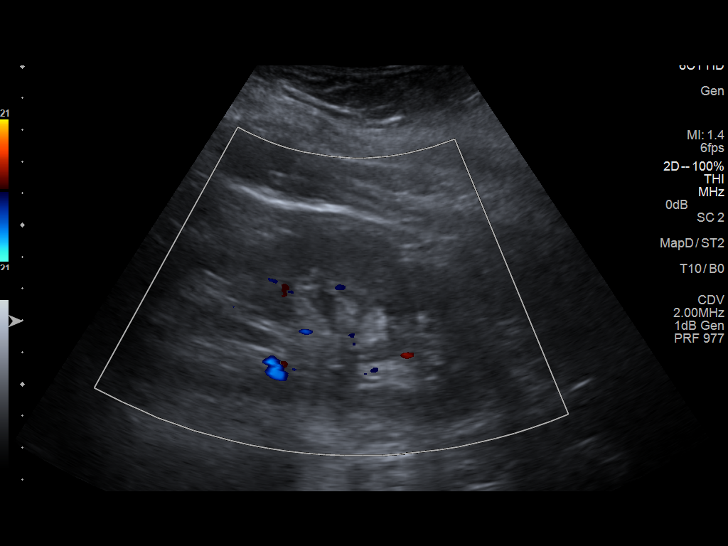
[im 38/38]
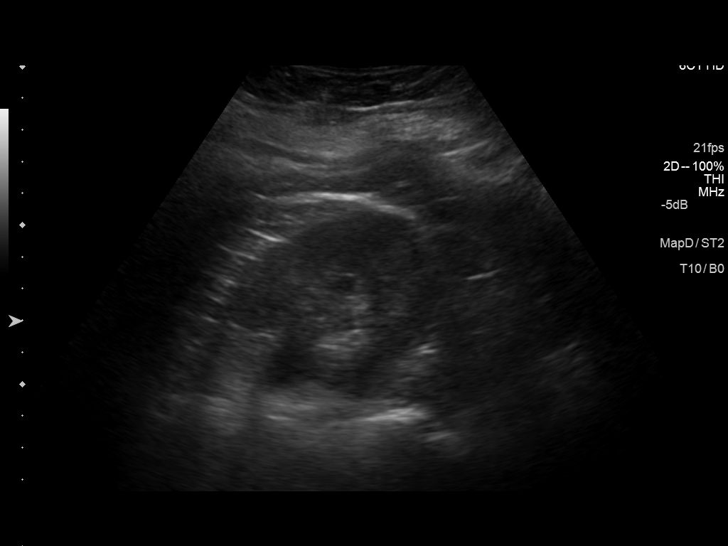

[14 of 25 positions shown; findings below may reference images not displayed]

FINDINGS: Right Kidney:

Length: 13.5 cm. The renal cortical echotexture is normal. There is
no hydronephrosis. There is a lower pole cyst which measures 2.2 cm
in greatest dimension. A right ureteral jet is demonstrated.

Left Kidney:

Length: 13.0 cm. Echogenicity within normal limits. There is mild
splitting of the central echo complex in dilation of the renal
pelvis consistent with mild hydronephrosis. There is no focal mass.
No obstructing stone is demonstrated.

Bladder:

The urinary bladder is adequately distended. A right ureteral jet
was demonstrated. No left ureteral jet was demonstrated.
IMPRESSION: 1. Mild left-sided hydronephrosis.  No ureteral jet is demonstrated.
2. No right-sided hydronephrosis. There is a 2.2 cm diameter lower
pole cyst.
3. No bladder abnormality is demonstrated.

## 2016-06-22 ENCOUNTER — Ambulatory Visit (INDEPENDENT_AMBULATORY_CARE_PROVIDER_SITE_OTHER): Payer: BLUE CROSS/BLUE SHIELD | Admitting: Physician Assistant

## 2016-06-22 ENCOUNTER — Encounter: Payer: Self-pay | Admitting: Physician Assistant

## 2016-06-22 VITALS — BP 162/96 | HR 80 | Temp 98.3°F | Wt 233.8 lb

## 2016-06-22 DIAGNOSIS — I10 Essential (primary) hypertension: Secondary | ICD-10-CM | POA: Diagnosis not present

## 2016-06-22 MED ORDER — AMLODIPINE BESYLATE 5 MG PO TABS: 5.0000 mg | ORAL_TABLET | Freq: Every day | ORAL | 0 refills | Status: DC

## 2016-06-22 NOTE — Progress Notes (Signed)
   Patient: Amanda Ibarra Female    DOB: 26-Mar-1971   45 y.o.   MRN: [id number] Visit Date: 06/22/2016  Today's Provider: Mar Daring, PA-C   Chief Complaint  Patient presents with  . Hypertension  . Follow-up   Subjective:    HPI  Hypertension, follow-up:  BP Readings from Last 3 Encounters:  06/22/16 (!) 162/96  01/18/16 (!) 146/80  05/07/15 (!) 146/88    She was last seen for hypertension 5 months ago.  BP at that visit was 146/80. Management changes since that visit include continue Metoprolol 25 mg. She reports good compliance with treatment. She is having side effects. headaches She is exercising. She is adherent to low salt diet.   Outside blood pressures are being checked. She is experiencing none.  Patient denies none.   Cardiovascular risk factors include hypertension.  Use of agents associated with hypertension: none.     Weight trend: stable Wt Readings from Last 3 Encounters:  06/22/16 233 lb 12.8 oz (106.1 kg)  01/18/16 232 lb (105.2 kg)  05/07/15 239 lb (108.4 kg)    Current diet: well balanced  ------------------------------------------------------------------------    Previous Medications   CHOLECALCIFEROL (VITAMIN D) 2000 UNITS TABLET    Take 2,000 Units by mouth daily.   LEVONORGESTREL (MIRENA) 20 MCG/24HR IUD    by Intrauterine route.   METOPROLOL SUCCINATE (TOPROL-XL) 25 MG 24 HR TABLET    TAKE 1 TABLET BY MOUTH EVERY MORNING   MULTIPLE VITAMIN PO    Take 1 tablet by mouth daily.    Review of Systems  Constitutional: Negative.   Eyes: Negative for visual disturbance.  Respiratory: Negative.   Cardiovascular: Negative.   Gastrointestinal: Negative.   Neurological: Positive for headaches. Negative for dizziness.    Social History  Substance Use Topics  . Smoking status: Never Smoker  . Smokeless tobacco: Never Used  . Alcohol use No   Objective:   BP (!) 162/96 (BP Location: Right Arm, Patient Position: Sitting, Cuff  Size: Large)   Pulse 80   Temp 98.3 F (36.8 C) (Oral)   Wt 233 lb 12.8 oz (106.1 kg)   BMI 35.55 kg/m   Physical Exam  Constitutional: She appears well-developed and well-nourished. No distress.  Cardiovascular: Normal rate, regular rhythm and normal heart sounds.  Exam reveals no gallop and no friction rub.   No murmur heard. Pulmonary/Chest: Effort normal and breath sounds normal. No respiratory distress. She has no wheezes. She has no rales.  Musculoskeletal: She exhibits no edema.  Skin: She is not diaphoretic.  Vitals reviewed.     Assessment & Plan:     1. Essential hypertension Will add amlodipine as below. Continue Metoprolol 25mg  daily. Will see her back in 4 weeks to recheck BP. She is to call if she has any adverse reaction, question or concern in the meantime. - amLODipine (NORVASC) 5 MG tablet; Take 1 tablet (5 mg total) by mouth daily.  Dispense: 30 tablet; Refill: 0   Follow up: No Follow-up on file.

## 2016-06-22 NOTE — Patient Instructions (Signed)
Amlodipine tablets  What is this medicine?  AMLODIPINE (am LOE di peen) is a calcium-channel blocker. It affects the amount of calcium found in your heart and muscle cells. This relaxes your blood vessels, which can reduce the amount of work the heart has to do. This medicine is used to lower high blood pressure. It is also used to prevent chest pain.  This medicine may be used for other purposes; ask your health care provider or pharmacist if you have questions.  What should I tell my health care provider before I take this medicine?  They need to know if you have any of these conditions:  -heart problems like heart failure or aortic stenosis  -liver disease  -an unusual or allergic reaction to amlodipine, other medicines, foods, dyes, or preservatives  -pregnant or trying to get pregnant  -breast-feeding  How should I use this medicine?  Take this medicine by mouth with a glass of water. Follow the directions on the prescription label. Take your medicine at regular intervals. Do not take more medicine than directed.  Talk to your pediatrician regarding the use of this medicine in children. Special care may be needed. This medicine has been used in children as young as 6.  Persons over 65 years old may have a stronger reaction to this medicine and need smaller doses.  Overdosage: If you think you have taken too much of this medicine contact a poison control center or emergency room at once.  NOTE: This medicine is only for you. Do not share this medicine with others.  What if I miss a dose?  If you miss a dose, take it as soon as you can. If it is almost time for your next dose, take only that dose. Do not take double or extra doses.  What may interact with this medicine?  -herbal or dietary supplements  -local or general anesthetics  -medicines for high blood pressure  -medicines for prostate problems  -rifampin  This list may not describe all possible interactions. Give your health care provider a list of all the  medicines, herbs, non-prescription drugs, or dietary supplements you use. Also tell them if you smoke, drink alcohol, or use illegal drugs. Some items may interact with your medicine.  What should I watch for while using this medicine?  Visit your doctor or health care professional for regular check ups. Check your blood pressure and pulse rate regularly. Ask your health care professional what your blood pressure and pulse rate should be, and when you should contact him or her.  This medicine may make you feel confused, dizzy or lightheaded. Do not drive, use machinery, or do anything that needs mental alertness until you know how this medicine affects you. To reduce the risk of dizzy or fainting spells, do not sit or stand up quickly, especially if you are an older patient. Avoid alcoholic drinks; they can make you more dizzy.  Do not suddenly stop taking amlodipine. Ask your doctor or health care professional how you can gradually reduce the dose.  What side effects may I notice from receiving this medicine?  Side effects that you should report to your doctor or health care professional as soon as possible:  -allergic reactions like skin rash, itching or hives, swelling of the face, lips, or tongue  -breathing problems  -changes in vision or hearing  -chest pain  -fast, irregular heartbeat  -swelling of legs or ankles  Side effects that usually do not require medical attention (report   to your doctor or health care professional if they continue or are bothersome):  -dry mouth  -facial flushing  -nausea, vomiting  -stomach gas, pain  -tired, weak  -trouble sleeping  This list may not describe all possible side effects. Call your doctor for medical advice about side effects. You may report side effects to FDA at 1-800-FDA-1088.  Where should I keep my medicine?  Keep out of the reach of children.  Store at room temperature between 59 and 86 degrees F (15 and 30 degrees C). Protect from light. Keep container tightly  closed. Throw away any unused medicine after the expiration date.  NOTE: This sheet is a summary. It may not cover all possible information. If you have questions about this medicine, talk to your doctor, pharmacist, or health care provider.     © 2016, Elsevier/Gold Standard. (2012-09-21 11:40:58)

## 2016-07-21 ENCOUNTER — Encounter: Payer: Self-pay | Admitting: Physician Assistant

## 2016-07-21 ENCOUNTER — Ambulatory Visit (INDEPENDENT_AMBULATORY_CARE_PROVIDER_SITE_OTHER): Payer: BLUE CROSS/BLUE SHIELD | Admitting: Physician Assistant

## 2016-07-21 VITALS — BP 122/80 | HR 75 | Temp 98.0°F | Resp 16 | Wt 232.4 lb

## 2016-07-21 DIAGNOSIS — I10 Essential (primary) hypertension: Secondary | ICD-10-CM

## 2016-07-21 DIAGNOSIS — Z136 Encounter for screening for cardiovascular disorders: Secondary | ICD-10-CM

## 2016-07-21 DIAGNOSIS — Z13220 Encounter for screening for lipoid disorders: Secondary | ICD-10-CM | POA: Diagnosis not present

## 2016-07-21 DIAGNOSIS — Z833 Family history of diabetes mellitus: Secondary | ICD-10-CM | POA: Diagnosis not present

## 2016-07-21 MED ORDER — AMLODIPINE BESYLATE 5 MG PO TABS: 5.0000 mg | ORAL_TABLET | Freq: Every day | ORAL | 3 refills | Status: DC

## 2016-07-21 NOTE — Progress Notes (Signed)
Patient: Amanda Ibarra Female    DOB: 10/18/1971   45 y.o.   MRN: [id number] Visit Date: 07/21/2016  Today's Provider: Mar Daring, PA-C   Chief Complaint  Patient presents with  . Follow-up    HTN   Subjective:    HPI  Hypertension, follow-up:  BP Readings from Last 3 Encounters:  07/21/16 122/80  06/22/16 (!) 162/96  01/18/16 (!) 146/80    She was last seen for hypertension 1 months ago.  BP at that visit was 162/96. Management since that visit includes Amlodipine was added. She reports excellent compliance with treatment. She is not having side effects.  She is exercising. She is adherent to low salt diet.   Outside blood pressures are 120's and 80's. She is experiencing none.  Patient denies chest pain, chest pressure/discomfort, exertional chest pressure/discomfort, fatigue, irregular heart beat, lower extremity edema and palpitations.      Weight trend: stable Wt Readings from Last 3 Encounters:  07/21/16 232 lb 6.4 oz (105.4 kg)  06/22/16 233 lb 12.8 oz (106.1 kg)  01/18/16 232 lb (105.2 kg)    Current diet: in general, a "healthy" diet    ------------------------------------------------------------------------     Allergies  Allergen Reactions  . Azithromycin Other (See Comments)    Other reaction(s): Vomiting  . Codeine Other (See Comments)    unknown  . Penicillins Rash     Current Outpatient Prescriptions:  .  amLODipine (NORVASC) 5 MG tablet, Take 1 tablet (5 mg total) by mouth daily., Disp: 30 tablet, Rfl: 0 .  Cholecalciferol (VITAMIN D) 2000 UNITS tablet, Take 2,000 Units by mouth daily., Disp: , Rfl:  .  levonorgestrel (MIRENA) 20 MCG/24HR IUD, by Intrauterine route., Disp: , Rfl:  .  metoprolol succinate (TOPROL-XL) 25 MG 24 hr tablet, TAKE 1 TABLET BY MOUTH EVERY MORNING, Disp: 90 tablet, Rfl: 1 .  MULTIPLE VITAMIN PO, Take 1 tablet by mouth daily., Disp: , Rfl:   Review of Systems  Constitutional: Negative.     Respiratory: Negative.   Cardiovascular: Negative.   Gastrointestinal: Negative.   Neurological: Negative.     Social History  Substance Use Topics  . Smoking status: Never Smoker  . Smokeless tobacco: Never Used  . Alcohol use No   Objective:   BP 122/80 (BP Location: Left Arm, Patient Position: Sitting, Cuff Size: Normal)   Pulse 75   Temp 98 F (36.7 C) (Oral)   Resp 16   Wt 232 lb 6.4 oz (105.4 kg)   BMI 35.34 kg/m   Physical Exam  Constitutional: She appears well-developed and well-nourished. No distress.  Neck: Normal range of motion. Neck supple. No tracheal deviation present. No thyromegaly present.  Cardiovascular: Normal rate, regular rhythm and normal heart sounds.  Exam reveals no gallop and no friction rub.   No murmur heard. Pulmonary/Chest: Effort normal and breath sounds normal. No respiratory distress. She has no wheezes. She has no rales.  Musculoskeletal: She exhibits no edema.  Lymphadenopathy:    She has no cervical adenopathy.  Skin: She is not diaphoretic.  Vitals reviewed.     Assessment & Plan:     1. Essential hypertension Stable. Diagnosis pulled for medication refill. Continue current medical treatment plan. Will check labs as below and f/u pending results. - amLODipine (NORVASC) 5 MG tablet; Take 1 tablet (5 mg total) by mouth daily.  Dispense: 90 tablet; Refill: 3 - CBC with Differential - Comprehensive Metabolic Panel (CMET) - TSH  2. Encounter for lipid screening for cardiovascular disease Will check labs as below and f/u pending results. - Lipid Profile  3. Family history of diabetes mellitus in father Will check labs as below and f/u pending results. - HgB A1c  The entirety of the information documented in the History of Present Illness, Review of Systems and Physical Exam were personally obtained by me. Portions of this information were initially documented by Lyndel Pleasure, CMA and reviewed by me for thoroughness and  accuracy.      Mar Daring, PA-C  [facility]

## 2016-07-21 NOTE — Patient Instructions (Signed)
DASH Eating Plan  DASH stands for "Dietary Approaches to Stop Hypertension." The DASH eating plan is a healthy eating plan that has been shown to reduce high blood pressure (hypertension). Additional health benefits may include reducing the risk of type 2 diabetes mellitus, heart disease, and stroke. The DASH eating plan may also help with weight loss.  WHAT DO I NEED TO KNOW ABOUT THE DASH EATING PLAN?  For the DASH eating plan, you will follow these general guidelines:  · Choose foods with a percent daily value for sodium of less than 5% (as listed on the food label).  · Use salt-free seasonings or herbs instead of table salt or sea salt.  · Check with your health care provider or pharmacist before using salt substitutes.  · Eat lower-sodium products, often labeled as "lower sodium" or "no salt added."  · Eat fresh foods.  · Eat more vegetables, fruits, and low-fat dairy products.  · Choose whole grains. Look for the word "whole" as the first word in the ingredient list.  · Choose fish and skinless chicken or turkey more often than red meat. Limit fish, poultry, and meat to 6 oz (170 g) each day.  · Limit sweets, desserts, sugars, and sugary drinks.  · Choose heart-healthy fats.  · Limit cheese to 1 oz (28 g) per day.  · Eat more home-cooked food and less restaurant, buffet, and fast food.  · Limit fried foods.  · Cook foods using methods other than frying.  · Limit canned vegetables. If you do use them, rinse them well to decrease the sodium.  · When eating at a restaurant, ask that your food be prepared with less salt, or no salt if possible.  WHAT FOODS CAN I EAT?  Seek help from a dietitian for individual calorie needs.  Grains  Whole grain or whole wheat bread. Brown rice. Whole grain or whole wheat pasta. Quinoa, bulgur, and whole grain cereals. Low-sodium cereals. Corn or whole wheat flour tortillas. Whole grain cornbread. Whole grain crackers. Low-sodium crackers.  Vegetables  Fresh or frozen vegetables  (raw, steamed, roasted, or grilled). Low-sodium or reduced-sodium tomato and vegetable juices. Low-sodium or reduced-sodium tomato sauce and paste. Low-sodium or reduced-sodium canned vegetables.   Fruits  All fresh, canned (in natural juice), or frozen fruits.  Meat and Other Protein Products  Ground beef (85% or leaner), grass-fed beef, or beef trimmed of fat. Skinless chicken or turkey. Ground chicken or turkey. Pork trimmed of fat. All fish and seafood. Eggs. Dried beans, peas, or lentils. Unsalted nuts and seeds. Unsalted canned beans.  Dairy  Low-fat dairy products, such as skim or 1% milk, 2% or reduced-fat cheeses, low-fat ricotta or cottage cheese, or plain low-fat yogurt. Low-sodium or reduced-sodium cheeses.  Fats and Oils  Tub margarines without trans fats. Light or reduced-fat mayonnaise and salad dressings (reduced sodium). Avocado. Safflower, olive, or canola oils. Natural peanut or almond butter.  Other  Unsalted popcorn and pretzels.  The items listed above may not be a complete list of recommended foods or beverages. Contact your dietitian for more options.  WHAT FOODS ARE NOT RECOMMENDED?  Grains  White bread. White pasta. White rice. Refined cornbread. Bagels and croissants. Crackers that contain trans fat.  Vegetables  Creamed or fried vegetables. Vegetables in a cheese sauce. Regular canned vegetables. Regular canned tomato sauce and paste. Regular tomato and vegetable juices.  Fruits  Dried fruits. Canned fruit in light or heavy syrup. Fruit juice.  Meat and Other Protein   Products  Fatty cuts of meat. Ribs, chicken wings, bacon, sausage, bologna, salami, chitterlings, fatback, hot dogs, bratwurst, and packaged luncheon meats. Salted nuts and seeds. Canned beans with salt.  Dairy  Whole or 2% milk, cream, half-and-half, and cream cheese. Whole-fat or sweetened yogurt. Full-fat cheeses or blue cheese. Nondairy creamers and whipped toppings. Processed cheese, cheese spreads, or cheese  curds.  Condiments  Onion and garlic salt, seasoned salt, table salt, and sea salt. Canned and packaged gravies. Worcestershire sauce. Tartar sauce. Barbecue sauce. Teriyaki sauce. Soy sauce, including reduced sodium. Steak sauce. Fish sauce. Oyster sauce. Cocktail sauce. Horseradish. Ketchup and mustard. Meat flavorings and tenderizers. Bouillon cubes. Hot sauce. Tabasco sauce. Marinades. Taco seasonings. Relishes.  Fats and Oils  Butter, stick margarine, lard, shortening, ghee, and bacon fat. Coconut, palm kernel, or palm oils. Regular salad dressings.  Other  Pickles and olives. Salted popcorn and pretzels.  The items listed above may not be a complete list of foods and beverages to avoid. Contact your dietitian for more information.  WHERE CAN I FIND MORE INFORMATION?  National Heart, Lung, and Blood Institute: www.nhlbi.nih.gov/health/health-topics/topics/dash/     This information is not intended to replace advice given to you by your health care provider. Make sure you discuss any questions you have with your health care provider.     Document Released: 10/13/2011 Document Revised: 11/14/2014 Document Reviewed: 08/28/2013  Elsevier Interactive Patient Education ©2016 Elsevier Inc.

## 2016-07-22 ENCOUNTER — Telehealth: Payer: Self-pay

## 2016-07-22 LAB — COMPREHENSIVE METABOLIC PANEL
ALK PHOS: 55 IU/L (ref 39–117)
ALT: 18 IU/L (ref 0–32)
AST: 21 IU/L (ref 0–40)
Albumin/Globulin Ratio: 1.5 (ref 1.2–2.2)
Albumin: 4.6 g/dL (ref 3.5–5.5)
BUN / CREAT RATIO: 18 (ref 9–23)
BUN: 14 mg/dL (ref 6–24)
Bilirubin Total: 1.1 mg/dL (ref 0.0–1.2)
CO2: 23 mmol/L (ref 18–29)
Calcium: 10.2 mg/dL (ref 8.7–10.2)
Chloride: 100 mmol/L (ref 96–106)
Creatinine, Ser: 0.76 mg/dL (ref 0.57–1.00)
GFR calc Af Amer: 110 mL/min/{1.73_m2} (ref >59)
GFR calc non Af Amer: 95 mL/min/{1.73_m2} (ref >59)
GLOBULIN, TOTAL: 3 g/dL (ref 1.5–4.5)
Glucose: 92 mg/dL (ref 65–99)
POTASSIUM: 4.4 mmol/L (ref 3.5–5.2)
Sodium: 139 mmol/L (ref 134–144)
Total Protein: 7.6 g/dL (ref 6.0–8.5)

## 2016-07-22 LAB — CBC WITH DIFFERENTIAL/PLATELET
BASOS: 1 %
Basophils Absolute: 0.1 10*3/uL (ref 0.0–0.2)
EOS (ABSOLUTE): 0.2 10*3/uL (ref 0.0–0.4)
Eos: 2 %
HEMATOCRIT: 40.8 % (ref 34.0–46.6)
Hemoglobin: 13.9 g/dL (ref 11.1–15.9)
Immature Grans (Abs): 0 10*3/uL (ref 0.0–0.1)
Immature Granulocytes: 0 %
Lymphocytes Absolute: 3.4 10*3/uL — ABNORMAL HIGH (ref 0.7–3.1)
Lymphs: 39 %
MCH: 30.1 pg (ref 26.6–33.0)
MCHC: 34.1 g/dL (ref 31.5–35.7)
MCV: 88 fL (ref 79–97)
MONOS ABS: 0.8 10*3/uL (ref 0.1–0.9)
Monocytes: 9 %
NEUTROS ABS: 4.2 10*3/uL (ref 1.4–7.0)
Neutrophils: 49 %
Platelets: 341 10*3/uL (ref 150–379)
RBC: 4.62 x10E6/uL (ref 3.77–5.28)
RDW: 13.4 % (ref 12.3–15.4)
WBC: 8.7 10*3/uL (ref 3.4–10.8)

## 2016-07-22 LAB — TSH: TSH: 2.21 u[IU]/mL (ref 0.450–4.500)

## 2016-07-22 LAB — HEMOGLOBIN A1C
ESTIMATED AVERAGE GLUCOSE: 120 mg/dL
HEMOGLOBIN A1C: 5.8 % — AB (ref 4.8–5.6)

## 2016-07-22 LAB — LIPID PANEL
CHOL/HDL RATIO: 5.1 ratio — AB (ref 0.0–4.4)
Cholesterol, Total: 209 mg/dL — ABNORMAL HIGH (ref 100–199)
HDL: 41 mg/dL (ref >39)
LDL CALC: 126 mg/dL — AB (ref 0–99)
TRIGLYCERIDES: 209 mg/dL — AB (ref 0–149)
VLDL CHOLESTEROL CAL: 42 mg/dL — AB (ref 5–40)

## 2016-07-22 NOTE — Telephone Encounter (Signed)
Patient advised as directed below. Voiced understanding.  Thanks,  -Amanda Ibarra 

## 2016-07-22 NOTE — Telephone Encounter (Signed)
-----   Message from Mar Daring, PA-C sent at 07/22/2016  8:37 AM EDT ----- Cholesterol and HgBA1c are both borderline high. Work on lifestyle modifications including healthy dieting, limiting fatty foods, carbs and sugars from diet. Add physical activity to regular routine. American [employer] Assoc recommends 150 min moderate physical activity per week. We will recheck in 6 months.

## 2016-08-11 ENCOUNTER — Telehealth: Payer: Self-pay | Admitting: Family Medicine

## 2016-08-11 DIAGNOSIS — Z23 Encounter for immunization: Secondary | ICD-10-CM | POA: Diagnosis not present

## 2016-08-11 NOTE — Telephone Encounter (Signed)
Pt called wanting Sonia Baller to call her back about her blood pressure medication that she was recently put on.  Her call back [phone]  Thanks Con Memos

## 2016-08-11 NOTE — Telephone Encounter (Signed)
Patient was concerned because her menstrual cycle was heavier than normal this month. Patient does have IUD. States menstruals are normally lighter but that this was much heavier. She also reports that she bled a little when getting her flu shot. She was wanting to know if amlodipine would make her bleed more. I advised that due to mechanism of dilating blood vessels it is possible but not a common adverse effect that would be life threatening. All questions were answered and she is going to continue medication for now and monitor. Of note: platelets were normal at last lab draw. BP is stable as well. Sunday reading at home was 120/82.

## 2016-08-12 ENCOUNTER — Other Ambulatory Visit: Payer: Self-pay | Admitting: Family Medicine

## 2016-08-12 DIAGNOSIS — I10 Essential (primary) hypertension: Secondary | ICD-10-CM

## 2016-08-12 NOTE — Telephone Encounter (Signed)
Please review-aa 

## 2016-12-27 ENCOUNTER — Telehealth: Payer: Self-pay | Admitting: Physician Assistant

## 2016-12-27 NOTE — Telephone Encounter (Signed)
Pt twisted ankle/foot in gravel >3 wks and is still having soreness and favoring it when she walks.  She wanted to see if there was any advice that could be given to her.  She states she really doesn't want to come in unless she needs to because she is trying to avoid the flu/stomach bugs.

## 2016-12-27 NOTE — Telephone Encounter (Signed)
Amanda Ibarra,  Just wanted you to give you a heads up on this patient.  I discussed at length what may be going on with the foot, what is recommended and possible consequences of not properly diagnosing and treating the injury could involve.  She expressed understanding and chooses to wait on being seen.   The patient has not seen anyone about the injury.  She was advised that the best thing to do would be seen.  She is very concerned about missing work and also being in the office with all the illnesses going around.  I advised her that we have no way of knowing if she has just a sprain, a fracture of possible a tear of some kind.  She has been advised that she should be seen, if not she should rest, ice and elevate the foot.  She has stated that she understands that not caring for the injury may cause more damage depending on what is actually going on.  She is going to use crutches and ice,elevate and rest the foot as much as possible over the next week and states if she is not better by Monday she will call to be seen. ED

## 2016-12-27 NOTE — Telephone Encounter (Signed)
Ok thank you so much. Agree with plan given and do recommend patient being evaluated.

## 2017-01-03 ENCOUNTER — Encounter: Payer: Self-pay | Admitting: Physician Assistant

## 2017-01-03 ENCOUNTER — Ambulatory Visit (INDEPENDENT_AMBULATORY_CARE_PROVIDER_SITE_OTHER): Payer: BLUE CROSS/BLUE SHIELD | Admitting: Physician Assistant

## 2017-01-03 ENCOUNTER — Ambulatory Visit
Admission: RE | Admit: 2017-01-03 | Discharge: 2017-01-03 | Disposition: A | Discharge status: Home / Self Care | Payer: BLUE CROSS/BLUE SHIELD | Source: Ambulatory Visit | Attending: Physician Assistant | Admitting: Physician Assistant

## 2017-01-03 ENCOUNTER — Telehealth: Payer: Self-pay

## 2017-01-03 VITALS — BP 170/78 | HR 102 | Temp 99.1°F | Resp 16 | Wt 239.4 lb

## 2017-01-03 DIAGNOSIS — X58XXXA Exposure to other specified factors, initial encounter: Secondary | ICD-10-CM | POA: Insufficient documentation

## 2017-01-03 DIAGNOSIS — S93401A Sprain of unspecified ligament of right ankle, initial encounter: Secondary | ICD-10-CM | POA: Diagnosis not present

## 2017-01-03 DIAGNOSIS — I10 Essential (primary) hypertension: Secondary | ICD-10-CM | POA: Diagnosis not present

## 2017-01-03 DIAGNOSIS — M25571 Pain in right ankle and joints of right foot: Secondary | ICD-10-CM | POA: Diagnosis not present

## 2017-01-03 NOTE — Patient Instructions (Signed)
Ankle Sprain An ankle sprain is a stretch or tear in one of the tough, fiber-like tissues (ligaments) in the ankle. The ligaments in your ankle help to hold the bones of the ankle together. What are the causes? This condition is often caused by stepping on or falling on the outer edge of the foot. What increases the risk? This condition is more likely to develop in people who play sports. What are the signs or symptoms? Symptoms of this condition include:  Pain in your ankle.  Swelling.  Bruising. Bruising may develop right after you sprain your ankle or 1-2 days later.  Trouble standing or walking, especially when you turn or change directions. How is this diagnosed? This condition is diagnosed with a physical exam. During the exam, your health care provider will press on certain parts of your foot and ankle and try to move them in certain ways. X-rays may be taken to see how severe the sprain is and to check for broken bones. How is this treated? This condition may be treated with:  A brace. This is used to keep the ankle from moving until it heals.  An elastic bandage. This is used to support the ankle.  Crutches.  Pain medicine.  Surgery. This may be needed if the sprain is severe.  Physical therapy. This may help to improve the range of motion in the ankle. Follow these instructions at home:  Rest your ankle.  Take over-the-counter and prescription medicines only as told by your health care provider.  For 2-3 days, keep your ankle raised (elevated) above the level of your heart as much as possible.  If directed, apply ice to the area:  Put ice in a plastic bag.  Place a towel between your skin and the bag.  Leave the ice on for 20 minutes, 2-3 times a day.  If you were given a brace:  Wear it as directed.  Remove it to shower or bathe.  Try not to move your ankle much, but wiggle your toes from time to time. This helps to prevent swelling.  If you were  given an elastic bandage (dressing):  Remove it to shower or bathe.  Try not to move your ankle much, but wiggle your toes from time to time. This helps to prevent swelling.  Adjust the dressing to make it more comfortable if it feels too tight.  Loosen the dressing if you have numbness or tingling in your foot, or if your foot becomes cold and blue.  If you have crutches, use them as told by your health care provider. Continue to use them until you can walk without feeling pain in your ankle. Contact a health care provider if:  You have rapidly increasing bruising or swelling.  Your pain is not relieved with medicine. Get help right away if:  Your toes or foot becomes numb or blue.  You have severe pain that gets worse. This information is not intended to replace advice given to you by your health care provider. Make sure you discuss any questions you have with your health care provider. Document Released: 10/24/2005 Document Revised: 03/02/2016 Document Reviewed: 05/26/2015 Elsevier Interactive Patient Education  2017 Elsevier Inc.  

## 2017-01-03 NOTE — Telephone Encounter (Signed)
Patient advised as directed below.  Thanks,  -Tishia Maestre 

## 2017-01-03 NOTE — Telephone Encounter (Signed)
-----   Message from Mar Daring, [place] sent at 01/03/2017 11:05 AM EST ----- Ankle and foot xray are normal. Recommend continued therapy of rest, ice, compression, elevation and slowly weight bear as tolerated over the next couple of weeks. If ankle pain continues over the next two weeks and has not improved at all we will refer to foot/ankle specialist.

## 2017-01-03 NOTE — Progress Notes (Addendum)
Patient: Amanda Ibarra Female    DOB: Mar 24, 1971   46 y.o.   MRN: [id number] Visit Date: 01/03/2017  Today's Provider: Mar Daring, PA-C   Chief Complaint  Patient presents with  . Follow-up    Hypertension  . Foot Pain   Subjective:    HPI  Hypertension, follow-up:  Vitals:   01/03/17 0853 01/03/17 0922  BP: (!) 160/70 (!) 170/78  Pulse: (!) 102   Resp: 16   Temp: 99.1 F (37.3 C)   TempSrc: Oral   Weight: 239 lb 6.4 oz (108.6 kg)      She was last seen for hypertension 4 months ago.  BP at that visit was 122/80. Management since that visit includes none. She reports excellent compliance with treatment. She is not having side effects.  She is not exercising. She is adherent to low salt diet.   Outside blood pressures are 120's/80's. She is experiencing none.  Patient denies chest pain, chest pressure/discomfort, exertional chest pressure/discomfort, fatigue, irregular heart beat, lower extremity edema and palpitations.   Cardiovascular risk factors include hypertension.  Use of agents associated with hypertension: none.   She reports that her son checked her blood pressure on Sunday and it was 126/88. She does report being very anxious today and gets nervous when coming to check her blood pressure here. She is also in pain due to the right ankle injury.  Weight trend: stable Wt Readings from Last 3 Encounters:  01/03/17 239 lb 6.4 oz (108.6 kg)  07/21/16 232 lb 6.4 oz (105.4 kg)  06/22/16 233 lb 12.8 oz (106.1 kg)    Current diet: in general, a "healthy" diet    ------------------------------------------------------------------------ Foot Pain: Patient is also here to get right foot injury evaluated. She injured her right foot >3 weeks. She twisted her foot in gravel. She reports it still hurts when she walks and puts weight on it. She is currently using crutches. She has tried ice, epsom salt and rest.     Allergies  Allergen Reactions    . Azithromycin Other (See Comments)    Other reaction(s): Vomiting  . Codeine Other (See Comments)    unknown  . Penicillins Rash     Current Outpatient Prescriptions:  .  amLODipine (NORVASC) 5 MG tablet, Take 1 tablet (5 mg total) by mouth daily., Disp: 90 tablet, Rfl: 3 .  Cholecalciferol (VITAMIN D) 2000 UNITS tablet, Take 2,000 Units by mouth daily., Disp: , Rfl:  .  levonorgestrel (MIRENA) 20 MCG/24HR IUD, by Intrauterine route., Disp: , Rfl:  .  metoprolol succinate (TOPROL-XL) 25 MG 24 hr tablet, TAKE 1 TABLET BY MOUTH EVERY MORNING, Disp: 90 tablet, Rfl: 1 .  MULTIPLE VITAMIN PO, Take 1 tablet by mouth daily., Disp: , Rfl:   Review of Systems  Constitutional: Negative.   Respiratory: Negative for cough, chest tightness and shortness of breath.   Cardiovascular: Negative for chest pain, palpitations and leg swelling.  Gastrointestinal: Negative for abdominal pain.  Musculoskeletal: Positive for arthralgias (right ankle). Negative for joint swelling.  Neurological: Negative for dizziness, weakness, light-headedness, numbness and headaches.  Psychiatric/Behavioral: The patient is nervous/anxious.     Social History  Substance Use Topics  . Smoking status: Never Smoker  . Smokeless tobacco: Never Used  . Alcohol use No   Objective:   BP (!) 170/78 (BP Location: Right Arm, Cuff Size: Normal)   Pulse (!) 102   Temp 99.1 F (37.3 C) (Oral)   Resp  16   Wt 239 lb 6.4 oz (108.6 kg)   BMI 36.40 kg/m   Physical Exam  Constitutional: She appears well-developed and well-nourished. No distress.  Neck: Normal range of motion. Neck supple. No tracheal deviation present. No thyromegaly present.  Cardiovascular: Normal rate, regular rhythm and normal heart sounds.  Exam reveals no gallop and no friction rub.   No murmur heard. Pulmonary/Chest: Effort normal and breath sounds normal. No respiratory distress. She has no wheezes. She has no rales.  Musculoskeletal: She exhibits no  edema.       Right ankle: She exhibits decreased range of motion. She exhibits no swelling, no ecchymosis and normal pulse. Tenderness. Head of 5th metatarsal tenderness found. Achilles tendon normal.       Left ankle: Normal.       Feet:  Lymphadenopathy:    She has no cervical adenopathy.  Skin: She is not diaphoretic.  Vitals reviewed.       Assessment & Plan:     1. Essential hypertension Elevated today in the office but home readings have been normal. Suspect white coat hypertension/anxiety component. She is to continue metoprolol 25mg , amlodipine 5mg . She is to call the office if home readings increase and are over 160/90. I will see her back in 6 months for recheck and labs.  2. Sprain of right ankle, unspecified ligament, initial encounter Suspect anke sprain but want to r/o fracture. Will get imaging as below. I will f/u pending results.  - DG Ankle Complete Right; Future - DG Foot Complete Right; Future       Mar Daring, PA-C  [facility]

## 2017-01-19 ENCOUNTER — Ambulatory Visit: Payer: BLUE CROSS/BLUE SHIELD | Admitting: Physician Assistant

## 2017-01-30 ENCOUNTER — Other Ambulatory Visit: Payer: Self-pay | Admitting: Physician Assistant

## 2017-01-30 DIAGNOSIS — I10 Essential (primary) hypertension: Secondary | ICD-10-CM

## 2017-01-31 NOTE — Telephone Encounter (Signed)
Last ov  01/03/17 Last filled 08/12/16 Please review. Thank you. sd

## 2017-02-22 ENCOUNTER — Ambulatory Visit (INDEPENDENT_AMBULATORY_CARE_PROVIDER_SITE_OTHER): Payer: BLUE CROSS/BLUE SHIELD | Admitting: Certified Nurse Midwife

## 2017-02-22 ENCOUNTER — Other Ambulatory Visit: Payer: Self-pay | Admitting: Physician Assistant

## 2017-02-22 ENCOUNTER — Encounter: Payer: Self-pay | Admitting: Certified Nurse Midwife

## 2017-02-22 VITALS — BP 142/82 | HR 113 | Ht 68.0 in | Wt 233.0 lb

## 2017-02-22 DIAGNOSIS — Z01419 Encounter for gynecological examination (general) (routine) without abnormal findings: Secondary | ICD-10-CM | POA: Diagnosis not present

## 2017-02-22 DIAGNOSIS — Z30431 Encounter for routine checking of intrauterine contraceptive device: Secondary | ICD-10-CM | POA: Diagnosis not present

## 2017-02-22 DIAGNOSIS — R1032 Left lower quadrant pain: Secondary | ICD-10-CM | POA: Diagnosis not present

## 2017-02-22 DIAGNOSIS — Z1231 Encounter for screening mammogram for malignant neoplasm of breast: Secondary | ICD-10-CM

## 2017-02-22 DIAGNOSIS — Z124 Encounter for screening for malignant neoplasm of cervix: Secondary | ICD-10-CM

## 2017-02-22 LAB — HM PAP SMEAR: HM PAP: NEGATIVE

## 2017-02-22 NOTE — Progress Notes (Signed)
Gynecology Annual Exam  PCP: Mar Daring, PA-C  Chief Complaint:  Chief Complaint  Patient presents with  . Gynecologic Exam    LLQ pain    History of Present Illness: Amanda Ibarra is a 46 year old Caucasian/White female, G1 P1001, who presents for her annual exam. She is having some concerns regarding a pain in her left groin that began 4 days ago. The pain has decreased in intensity, but has been constant and worse with getting up or rolling over. The pain was not associated with any GI or GU symptoms. Her menses are irregular. They occur every 2-4 weeks and last 2-3 days and are a light flow.   She reports mild dysmenorrhea x 3 days prior to menses. She does not need analgesia for the cramping.  The patient's past medical history is notable for a history of hypertension, kidney stones,  and menorrhagia. In 2013 she had a D&C and hysteroscopy for menorrhagia and path revealed polypoid tissue. She had a Mirena inserted 07/18/2012 for tx of menorrhagia. 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 Since her last annual GYN exam dated 02/17/2016 , her mother was diagnosed with breast cancer at age 25 and her step father died from lung cancer and Parkinson's disease at age 8. She is sexually active. She is currently using an IUD for contraception.  Her most recent pap smear was obtained 02/17/2016 and was negative.  Her most recent mammogram obtained on 02/17/2016 was normal and revealed no significant changes.  There is a family history of breast cancer in her mother and genetic testing has not been done. There is no family history of ovarian cancer.  The patient does do monthly self breast exams.  The patient does not smoke.  The patient does drink occasionally.  The patient does not use illegal drugs.  The patient is just getting back to exercising regularly since spraining her ankle  The patient does get adequate calcium in her diet.  She  has  had a recent cholesterol screen at PCP and it was normal.    Review of Systems: Review of Systems  Constitutional: Negative for chills, fever and weight loss.  HENT: Negative for congestion, sinus pain and sore throat.   Eyes: Negative for blurred vision and pain.  Respiratory: Negative for hemoptysis, shortness of breath and wheezing.   Cardiovascular: Negative for chest pain, palpitations and leg swelling.  Gastrointestinal: Positive for abdominal pain (left groin). Negative for blood in stool, diarrhea, heartburn, nausea and vomiting.  Genitourinary: Negative for dysuria, frequency, hematuria and urgency.       Positive for irregular bleeding on the Mirena  Musculoskeletal: Positive for joint pain (right ankle). Negative for back pain and myalgias.  Skin: Negative for itching and rash.  Neurological: Negative for dizziness, tingling and headaches.  Endo/Heme/Allergies: Negative for environmental allergies and polydipsia. Does not bruise/bleed easily.       Negative for hirsutism   Psychiatric/Behavioral: Negative for depression. The patient is not nervous/anxious and does not have insomnia.     Past Medical History:  Past Medical History:  Diagnosis Date  . CIN I (cervical intraepithelial neoplasia I)   . Hypertension   . Menorrhagia   . Vitamin D deficiency     Past Surgical History:  Past Surgical History:  Procedure Laterality Date  . CERVICAL BIOPSY  W/ LOOP ELECTRODE EXCISION  12/12/1995  . COLPOSCOPY  10/09/1996  . DILATION  AND CURETTAGE OF UTERUS  11/2011   for menorrhagia  . TONSILLECTOMY    . [place] teeth      Family History:  Family History  Problem Relation Age of Onset  . Hypertension Father   . Arthritis/Rheumatoid Mother   . Hyperlipidemia Mother   . Hypertension Mother   . Thyroid disease Mother   . Breast cancer Mother 56  . Bone cancer Maternal Grandmother   . Heart attack Maternal Grandfather   . Gout Maternal Grandfather     Social  History:  Social History   Social History  . Marital status: Married    Spouse name: N/A  . Number of children: 1  . Years of education: Bachelors   Occupational History  .  [employer]    Full Time   Social History Main Topics  . Smoking status: Never Smoker  . Smokeless tobacco: Never Used  . Alcohol use Yes     Comment: occ  . Drug use: No  . Sexual activity: Yes    Partners: Male    Birth control/ protection: IUD   Other Topics Concern  . Not on file   Social History Narrative  . No narrative on file    Allergies:  Allergies  Allergen Reactions  . Azithromycin Other (See Comments)    Other reaction(s): Vomiting  . Codeine Other (See Comments)    unknown  . Penicillins Rash    Medications: Prior to Admission medications   Medication Sig Start Date End Date Taking? Authorizing Provider  amLODipine (NORVASC) 5 MG tablet Take 1 tablet (5 mg total) by mouth daily. 07/21/16   Mar Daring, PA-C  Cholecalciferol (VITAMIN D) 2000 UNITS tablet Take 2,000 Units by mouth daily.    Historical Provider, MD  levonorgestrel (MIRENA) 20 MCG/24HR IUD by Intrauterine route.    Historical Provider, MD  metoprolol succinate (TOPROL-XL) 25 MG 24 hr tablet TAKE 1 TABLET BY MOUTH EVERY MORNING 01/31/17   Mar Daring, PA-C  MULTIPLE VITAMIN PO Take 1 tablet by mouth daily.    Historical Provider, MD    Physical Exam Vitals: Blood pressure (!) 142/82, pulse (!) 113, height 5\' 8"  (1.727 m), weight 233 lb (105.7 kg), last menstrual period 02/16/2017.  General: NAD HEENT: normocephalic, anicteric Thyroid: no enlargement, no palpable nodules Pulmonary: No increased work of breathing, CTAB Cardiovascular: RRR without murmur Breast: Breast symmetrical, no tenderness, no palpable nodules or masses, no skin or nipple retraction present, no nipple discharge.  No axillary, infraclavicular, or supraclavicular lymphadenopathy. Abdomen: soft, non-tender, non-distended,  obese.  Umbilicus without lesions.  No hepatomegaly or masses palpable. No evidence of hernia  Genitourinary:  External: Normal external female genitalia.  Normal urethral meatus, normal Bartholin's and Skene's glands.    Vagina: Normal vaginal mucosa, no evidence of prolapse.    Cervix: Grossly normal in appearance, no bleeding, IUD strings present, no CMT  Uterus: Retroflexed, decreased mobility, NT, NSSC  Adnexa:  no adnexal masses, NT  Rectal: deferred  Lymphatic: no evidence of inguinal lymphadenopathy Extremities: no edema, erythema, or tenderness Neurologic: Grossly intact Psychiatric: mood appropriate, affect full      Assessment: 46 y.o. G1P1001 well woman exam Left groin pain R/O musculoskeletal etiology. No evidence of a hernia IUD in place  Plan:   1) Mammogram - recommend yearly screening mammogram. Patient to schedule screening mamm[facility] at [facility]  2) Pap done  3) Contraception- Mirena expires in September. Patient will probably have her Mirena replaced. To call to  schedule appointment a couple months before then  4) Routine healthcare maintenance including cholesterol, diabetes screening  managed by PCP   5) RTO in one year for her annual exam. FU with PCP if left groin pain persists.  Dalia Heading, CNM

## 2017-02-24 LAB — IGP, APTIMA HPV
HPV Aptima: NEGATIVE
PAP SMEAR COMMENT: 0

## 2017-02-27 ENCOUNTER — Encounter: Payer: Self-pay | Admitting: Certified Nurse Midwife

## 2017-02-28 ENCOUNTER — Telehealth: Payer: Self-pay

## 2017-02-28 NOTE — Telephone Encounter (Signed)
Pt calling for pap results.  Adv normal.

## 2017-03-01 ENCOUNTER — Ambulatory Visit
Admission: RE | Admit: 2017-03-01 | Discharge: 2017-03-01 | Disposition: A | Discharge status: Home / Self Care | Payer: BLUE CROSS/BLUE SHIELD | Source: Ambulatory Visit | Attending: Physician Assistant | Admitting: Physician Assistant

## 2017-03-01 DIAGNOSIS — Z1231 Encounter for screening mammogram for malignant neoplasm of breast: Secondary | ICD-10-CM | POA: Insufficient documentation

## 2017-03-09 ENCOUNTER — Telehealth: Payer: Self-pay

## 2017-03-09 ENCOUNTER — Inpatient Hospital Stay
Admission: RE | Admit: 2017-03-09 | Discharge: 2017-03-09 | Disposition: A | Discharge status: Home / Self Care | Payer: Self-pay | Source: Ambulatory Visit | Attending: *Deleted | Admitting: *Deleted

## 2017-03-09 ENCOUNTER — Other Ambulatory Visit: Payer: Self-pay | Admitting: *Deleted

## 2017-03-09 DIAGNOSIS — Z9289 Personal history of other medical treatment: Secondary | ICD-10-CM

## 2017-03-09 NOTE — Telephone Encounter (Signed)
-----   Message from Mar Daring, [place] sent at 03/09/2017 12:21 PM EDT ----- Normal mammogram. Repeat screening in one year.

## 2017-03-09 NOTE — Telephone Encounter (Signed)
Pt advised. Emily Drozdowski, CMA  

## 2017-04-04 ENCOUNTER — Other Ambulatory Visit: Payer: Self-pay | Admitting: Physician Assistant

## 2017-04-04 DIAGNOSIS — I10 Essential (primary) hypertension: Secondary | ICD-10-CM

## 2017-05-03 ENCOUNTER — Other Ambulatory Visit: Payer: Self-pay

## 2017-05-03 DIAGNOSIS — I10 Essential (primary) hypertension: Secondary | ICD-10-CM

## 2017-05-03 MED ORDER — METOPROLOL SUCCINATE ER 25 MG PO TB24: 25.0000 mg | ORAL_TABLET | Freq: Every morning | ORAL | 1 refills | Status: DC

## 2017-05-03 NOTE — Telephone Encounter (Signed)
Last ov 01/03/17  Last filled 01/31/17 Please review. Thank you. sd

## 2017-08-07 ENCOUNTER — Ambulatory Visit (INDEPENDENT_AMBULATORY_CARE_PROVIDER_SITE_OTHER): Payer: BLUE CROSS/BLUE SHIELD | Admitting: Family Medicine

## 2017-08-07 ENCOUNTER — Encounter: Payer: Self-pay | Admitting: Family Medicine

## 2017-08-07 VITALS — BP 162/92 | HR 100 | Temp 98.3°F | Resp 16 | Wt 242.0 lb

## 2017-08-07 DIAGNOSIS — E669 Obesity, unspecified: Secondary | ICD-10-CM

## 2017-08-07 DIAGNOSIS — J0100 Acute maxillary sinusitis, unspecified: Secondary | ICD-10-CM | POA: Insufficient documentation

## 2017-08-07 DIAGNOSIS — Z6836 Body mass index (BMI) 36.0-36.9, adult: Secondary | ICD-10-CM

## 2017-08-07 DIAGNOSIS — I10 Essential (primary) hypertension: Secondary | ICD-10-CM | POA: Diagnosis not present

## 2017-08-07 DIAGNOSIS — R7303 Prediabetes: Secondary | ICD-10-CM | POA: Diagnosis not present

## 2017-08-07 MED ORDER — AMLODIPINE BESYLATE 10 MG PO TABS: 10.0000 mg | ORAL_TABLET | Freq: Every day | ORAL | 2 refills | Status: DC

## 2017-08-07 MED ORDER — DOXYCYCLINE HYCLATE 100 MG PO TABS: 100.0000 mg | ORAL_TABLET | Freq: Two times a day (BID) | ORAL | 0 refills | Status: AC

## 2017-08-07 MED ORDER — FLUTICASONE PROPIONATE 50 MCG/ACT NA SUSP: 2.0000 | Freq: Every day | NASAL | 6 refills | Status: DC

## 2017-08-07 NOTE — Patient Instructions (Signed)

## 2017-08-07 NOTE — Assessment & Plan Note (Signed)
Uncontrolled Continue metoprolol at current dose Increase amlodipine to 10 mg daily 2 for annual labs, including CMP, lipid panel, A1c, CBC Follow-up in one month

## 2017-08-07 NOTE — Assessment & Plan Note (Signed)
Likely bacterial given improvement and then worsening Advised on seasonal antihistamine and flonase to help with likely allergic symptoms that are seasonal Can use flonase during acute sinusitis as well Treat with 7d course of doxycycline (no amoxicillin given penicillin allergy) Return precautions discussed

## 2017-08-07 NOTE — Assessment & Plan Note (Signed)
Discussed diet and exercise Recheck A1c 

## 2017-08-07 NOTE — Progress Notes (Signed)
Patient: Amanda Ibarra Female    DOB: 03/18/1971   46 y.o.   MRN: [id number] Visit Date: 08/07/2017  Today's Provider: Lavon Paganini, MD   Chief Complaint  Patient presents with  . Sinusitis  . Hypertension   Subjective:    Sinusitis  This is a new problem. Episode onset: x 8-9 days. Progression since onset: was improving at the end of last week, but is now worsening. There has been no fever. She is experiencing no pain. Associated symptoms include congestion, coughing ("somewhat" productive of white sputum), ear pain (mostly left ear; is concerned because she "has a tendency to get ear infections"), headaches, a hoarse voice, sinus pressure, sneezing (at onset) and a sore throat (at onset). Pertinent negatives include no chills, diaphoresis, neck pain, shortness of breath or swollen glands. Past treatments include acetaminophen and oral decongestants (Mucinex DM). The treatment provided no relief.  Also with ear pain.  Does not take antihistamine or Flonase.  Feels like this time of the year, gets a cold.  Does not get frequent sinus infections.   Hypertension Pt states this has been worsening since spring of this year. She is taking amlodipine 5 mg qhs and metoprolol succinate 25 mg po qhs. She reports good compliance with treatment. At home BP reads 140's-150's/80's-90's. She does admit to white coat syndrome. She denies chest pain, dyspnea, edema, heart palpitations.    Allergies  Allergen Reactions  . Azithromycin Other (See Comments)    Other reaction(s): Vomiting  . Codeine Other (See Comments)    unknown  . Penicillins Rash     Current Outpatient Prescriptions:  .  amLODipine (NORVASC) 5 MG tablet, TAKE ONE TABLET BY MOUTH EVERY DAY, Disp: 90 tablet, Rfl: 1 .  Cholecalciferol (VITAMIN D) 2000 UNITS tablet, Take 2,000 Units by mouth daily., Disp: , Rfl:  .  levonorgestrel (MIRENA) 20 MCG/24HR IUD, by Intrauterine route., Disp: , Rfl:  .  metoprolol succinate  (TOPROL-XL) 25 MG 24 hr tablet, Take 1 tablet (25 mg total) by mouth every morning., Disp: 90 tablet, Rfl: 1 .  MULTIPLE VITAMIN PO, Take 1 tablet by mouth daily., Disp: , Rfl:   Review of Systems  Constitutional: Negative for chills and diaphoresis.  HENT: Positive for congestion, ear pain (mostly left ear; is concerned because she "has a tendency to get ear infections"), hoarse voice, sinus pressure, sneezing (at onset) and sore throat (at onset).   Respiratory: Positive for cough ("somewhat" productive of white sputum). Negative for shortness of breath.   Cardiovascular: Negative for chest pain, palpitations and leg swelling.  Musculoskeletal: Negative for neck pain.  Neurological: Positive for headaches.    Social History  Substance Use Topics  . Smoking status: Never Smoker  . Smokeless tobacco: Never Used  . Alcohol use Yes     Comment: very rarely   Objective:   BP (!) 162/92 (BP Location: Left Arm, Patient Position: Sitting, Cuff Size: Normal)   Pulse 100   Temp 98.3 F (36.8 C) (Oral)   Resp 16   Wt 242 lb (109.8 kg)   LMP 07/10/2017   SpO2 98%   BMI 36.80 kg/m  Vitals:   08/07/17 0844  BP: (!) 162/92  Pulse: 100  Resp: 16  Temp: 98.3 F (36.8 C)  TempSrc: Oral  SpO2: 98%  Weight: 242 lb (109.8 kg)     Physical Exam  Constitutional: She is oriented to person, place, and time. She appears well-developed and well-nourished. No distress.  HENT:  Head: Normocephalic and atraumatic.  Right Ear: Tympanic membrane and external ear normal.  Left Ear: Tympanic membrane, external ear and ear canal normal.  Nose: Right sinus exhibits maxillary sinus tenderness. Right sinus exhibits no frontal sinus tenderness. Left sinus exhibits maxillary sinus tenderness. Left sinus exhibits no frontal sinus tenderness.  Mouth/Throat: Uvula is midline, oropharynx is clear and moist and mucous membranes are normal. No oropharyngeal exudate.  R ear canal slightly erythematous  Eyes:  Pupils are equal, round, and reactive to light. Conjunctivae are normal. No scleral icterus.  Neck: Neck supple. No thyromegaly present.  Cardiovascular: Normal rate, regular rhythm, normal heart sounds and intact distal pulses.   No murmur heard. Pulmonary/Chest: Effort normal and breath sounds normal. No respiratory distress. She has no wheezes. She has no rales.  Musculoskeletal: She exhibits no edema or deformity.  Lymphadenopathy:    She has no cervical adenopathy.  Neurological: She is alert and oriented to person, place, and time.  Skin: Skin is warm and dry. No rash noted.  Psychiatric: She has a normal mood and affect. Her behavior is normal.  Vitals reviewed.      Assessment & Plan:      Problem List Items Addressed This Visit      Cardiovascular and Mediastinum   BP (high blood pressure)    Uncontrolled Continue metoprolol at current dose Increase amlodipine to 10 mg daily 2 for annual labs, including CMP, lipid panel, A1c, CBC Follow-up in one month      Relevant Medications   amLODipine (NORVASC) 10 MG tablet   Other Relevant Orders   Comprehensive metabolic panel   Lipid panel     Respiratory   Acute non-recurrent maxillary sinusitis - Primary    Likely bacterial given improvement and then worsening Advised on seasonal antihistamine and flonase to help with likely allergic symptoms that are seasonal Can use flonase during acute sinusitis as well Treat with 7d course of doxycycline (no amoxicillin given penicillin allergy) Return precautions discussed      Relevant Medications   fluticasone (FLONASE) 50 MCG/ACT nasal spray   doxycycline (VIBRA-TABS) 100 MG tablet     Other   Adiposity    Briefly discussed diet and exercise Check lipid panel, A1c, CBC, CMP      Relevant Orders   Hemoglobin A1c   Comprehensive metabolic panel   Lipid panel   CBC   Prediabetes    Discussed diet and exercise Recheck A1c      Relevant Orders   Hemoglobin A1c        Meds ordered this encounter  Medications  . amLODipine (NORVASC) 10 MG tablet    Sig: Take 1 tablet (10 mg total) by mouth daily.    Dispense:  30 tablet    Refill:  2  . fluticasone (FLONASE) 50 MCG/ACT nasal spray    Sig: Place 2 sprays into both nostrils daily.    Dispense:  16 g    Refill:  6  . doxycycline (VIBRA-TABS) 100 MG tablet    Sig: Take 1 tablet (100 mg total) by mouth 2 (two) times daily.    Dispense:  14 tablet    Refill:  0    Return in about 4 weeks (around 09/04/2017) for HTN, labs f/u.      The entirety of the information documented in the History of Present Illness, Review of Systems and Physical Exam were personally obtained by me. Portions of this information were initially documented by Martha Clan,  CMA and reviewed by me for thoroughness and accuracy.     Lavon Paganini, MD  [facility]

## 2017-08-07 NOTE — Assessment & Plan Note (Signed)
Briefly discussed diet and exercise Check lipid panel, A1c, CBC, CMP

## 2017-08-21 ENCOUNTER — Telehealth: Payer: Self-pay | Admitting: Certified Nurse Midwife

## 2017-08-21 NOTE — Telephone Encounter (Signed)
Pt is schedule 09/15/17 with [facility] for mirena removal and insertion

## 2017-09-05 NOTE — Telephone Encounter (Signed)
Noted. Will order to arrive by apt date/time. 

## 2017-09-07 ENCOUNTER — Ambulatory Visit (INDEPENDENT_AMBULATORY_CARE_PROVIDER_SITE_OTHER): Payer: BLUE CROSS/BLUE SHIELD | Admitting: Physician Assistant

## 2017-09-07 ENCOUNTER — Encounter: Payer: Self-pay | Admitting: Physician Assistant

## 2017-09-07 VITALS — BP 138/80 | HR 76 | Temp 98.2°F | Resp 16 | Ht 68.0 in | Wt 244.0 lb

## 2017-09-07 DIAGNOSIS — Z6837 Body mass index (BMI) 37.0-37.9, adult: Secondary | ICD-10-CM | POA: Diagnosis not present

## 2017-09-07 DIAGNOSIS — I10 Essential (primary) hypertension: Secondary | ICD-10-CM | POA: Diagnosis not present

## 2017-09-07 MED ORDER — AMLODIPINE BESYLATE 10 MG PO TABS
10.0000 mg | ORAL_TABLET | Freq: Every day | ORAL | 1 refills | Status: DC
Start: 2017-09-07 — End: 2018-03-14

## 2017-09-07 NOTE — Patient Instructions (Signed)
DASH Eating Plan DASH stands for "Dietary Approaches to Stop Hypertension." The DASH eating plan is a healthy eating plan that has been shown to reduce high blood pressure (hypertension). It may also reduce your risk for type 2 diabetes, heart disease, and stroke. The DASH eating plan may also help with weight loss. What are tips for following this plan? General guidelines  Avoid eating more than 2,300 mg (milligrams) of salt (sodium) a day. If you have hypertension, you may need to reduce your sodium intake to 1,500 mg a day.  Limit alcohol intake to no more than 1 drink a day for nonpregnant women and 2 drinks a day for men. One drink equals 12 oz of beer, 5 oz of wine, or 1 oz of hard liquor.  Work with your health care provider to maintain a healthy body weight or to lose weight. Ask what an ideal weight is for you.  Get at least 30 minutes of exercise that causes your heart to beat faster (aerobic exercise) most days of the week. Activities may include walking, swimming, or biking.  Work with your health care provider or diet and nutrition specialist (dietitian) to adjust your eating plan to your individual calorie needs. Reading food labels  Check food labels for the amount of sodium per serving. Choose foods with less than 5 percent of the Daily Value of sodium. Generally, foods with less than 300 mg of sodium per serving fit into this eating plan.  To find whole grains, look for the word "whole" as the first word in the ingredient list. Shopping  Buy products labeled as "low-sodium" or "no salt added."  Buy fresh foods. Avoid canned foods and premade or frozen meals. Cooking  Avoid adding salt when cooking. Use salt-free seasonings or herbs instead of table salt or sea salt. Check with your health care provider or pharmacist before using salt substitutes.  Do not fry foods. Cook foods using healthy methods such as baking, boiling, grilling, and broiling instead.  Cook with  heart-healthy oils, such as olive, canola, soybean, or sunflower oil. Meal planning   Eat a balanced diet that includes: ? 5 or more servings of fruits and vegetables each day. At each meal, try to fill half of your plate with fruits and vegetables. ? Up to 6-8 servings of whole grains each day. ? Less than 6 oz of lean meat, poultry, or fish each day. A 3-oz serving of meat is about the same size as a deck of cards. One egg equals 1 oz. ? 2 servings of low-fat dairy each day. ? A serving of nuts, seeds, or beans 5 times each week. ? Heart-healthy fats. Healthy fats called Omega-3 fatty acids are found in foods such as flaxseeds and coldwater fish, like sardines, salmon, and mackerel.  Limit how much you eat of the following: ? Canned or prepackaged foods. ? Food that is high in trans fat, such as fried foods. ? Food that is high in saturated fat, such as fatty meat. ? Sweets, desserts, sugary drinks, and other foods with added sugar. ? Full-fat dairy products.  Do not salt foods before eating.  Try to eat at least 2 vegetarian meals each week.  Eat more home-cooked food and less restaurant, buffet, and fast food.  When eating at a restaurant, ask that your food be prepared with less salt or no salt, if possible. What foods are recommended? The items listed may not be a complete list. Talk with your dietitian about what   dietary choices are best for you. Grains Whole-grain or whole-wheat bread. Whole-grain or whole-wheat pasta. Brown rice. Oatmeal. Quinoa. Bulgur. Whole-grain and low-sodium cereals. Pita bread. Low-fat, low-sodium crackers. Whole-wheat flour tortillas. Vegetables Fresh or frozen vegetables (raw, steamed, roasted, or grilled). Low-sodium or reduced-sodium tomato and vegetable juice. Low-sodium or reduced-sodium tomato sauce and tomato paste. Low-sodium or reduced-sodium canned vegetables. Fruits All fresh, dried, or frozen fruit. Canned fruit in natural juice (without  added sugar). Meat and other protein foods Skinless chicken or turkey. Ground chicken or turkey. Pork with fat trimmed off. Fish and seafood. Egg whites. Dried beans, peas, or lentils. Unsalted nuts, nut butters, and seeds. Unsalted canned beans. Lean cuts of beef with fat trimmed off. Low-sodium, lean deli meat. Dairy Low-fat (1%) or fat-free (skim) milk. Fat-free, low-fat, or reduced-fat cheeses. Nonfat, low-sodium ricotta or cottage cheese. Low-fat or nonfat yogurt. Low-fat, low-sodium cheese. Fats and oils Soft margarine without trans fats. Vegetable oil. Low-fat, reduced-fat, or light mayonnaise and salad dressings (reduced-sodium). Canola, safflower, olive, soybean, and sunflower oils. Avocado. Seasoning and other foods Herbs. Spices. Seasoning mixes without salt. Unsalted popcorn and pretzels. Fat-free sweets. What foods are not recommended? The items listed may not be a complete list. Talk with your dietitian about what dietary choices are best for you. Grains Baked goods made with fat, such as croissants, muffins, or some breads. Dry pasta or rice meal packs. Vegetables Creamed or fried vegetables. Vegetables in a cheese sauce. Regular canned vegetables (not low-sodium or reduced-sodium). Regular canned tomato sauce and paste (not low-sodium or reduced-sodium). Regular tomato and vegetable juice (not low-sodium or reduced-sodium). Pickles. Olives. Fruits Canned fruit in a light or heavy syrup. Fried fruit. Fruit in cream or butter sauce. Meat and other protein foods Fatty cuts of meat. Ribs. Fried meat. Bacon. Sausage. Bologna and other processed lunch meats. Salami. Fatback. Hotdogs. Bratwurst. Salted nuts and seeds. Canned beans with added salt. Canned or smoked fish. Whole eggs or egg yolks. Chicken or turkey with skin. Dairy Whole or 2% milk, cream, and half-and-half. Whole or full-fat cream cheese. Whole-fat or sweetened yogurt. Full-fat cheese. Nondairy creamers. Whipped toppings.  Processed cheese and cheese spreads. Fats and oils Butter. Stick margarine. Lard. Shortening. Ghee. Bacon fat. Tropical oils, such as coconut, palm kernel, or palm oil. Seasoning and other foods Salted popcorn and pretzels. Onion salt, garlic salt, seasoned salt, table salt, and sea salt. Worcestershire sauce. Tartar sauce. Barbecue sauce. Teriyaki sauce. Soy sauce, including reduced-sodium. Steak sauce. Canned and packaged gravies. Fish sauce. Oyster sauce. Cocktail sauce. Horseradish that you find on the shelf. Ketchup. Mustard. Meat flavorings and tenderizers. Bouillon cubes. Hot sauce and Tabasco sauce. Premade or packaged marinades. Premade or packaged taco seasonings. Relishes. Regular salad dressings. Where to find more information:  National Heart, Lung, and Blood Institute: www.nhlbi.nih.gov  American Heart Association: www.heart.org Summary  The DASH eating plan is a healthy eating plan that has been shown to reduce high blood pressure (hypertension). It may also reduce your risk for type 2 diabetes, heart disease, and stroke.  With the DASH eating plan, you should limit salt (sodium) intake to 2,300 mg a day. If you have hypertension, you may need to reduce your sodium intake to 1,500 mg a day.  When on the DASH eating plan, aim to eat more fresh fruits and vegetables, whole grains, lean proteins, low-fat dairy, and heart-healthy fats.  Work with your health care provider or diet and nutrition specialist (dietitian) to adjust your eating plan to your individual   calorie needs. This information is not intended to replace advice given to you by your health care provider. Make sure you discuss any questions you have with your health care provider. Document Released: 10/13/2011 Document Revised: 10/17/2016 Document Reviewed: 10/17/2016 Elsevier Interactive Patient Education  2017 Elsevier Inc.  

## 2017-09-07 NOTE — Progress Notes (Signed)
Patient: Amanda Ibarra Female    DOB: Mar 04, 1971   46 y.o.   MRN: [id number] Visit Date: 09/07/2017  Today's Provider: Mar Daring, PA-C   Chief Complaint  Patient presents with  . Hypertension   Subjective:    HPI  Hypertension, follow-up:  BP Readings from Last 3 Encounters:  09/07/17 138/80  08/07/17 (!) 162/92  02/22/17 (!) 142/82    She was last seen for hypertension 1 months ago.  BP at that visit was 162/92. Management changes since that visit include start amlodipine 10 mg. She reports excellent compliance with treatment. She is having side effects. Swelling around ankles She is exercising. She is adherent to low salt diet.   Outside blood pressures are stable. She is experiencing lower extremity edema.  Patient denies chest pain.   Cardiovascular risk factors include hypertension and obesity (BMI >= 30 kg/m2).  Use of agents associated with hypertension: none.     Weight trend: stable Wt Readings from Last 3 Encounters:  09/07/17 244 lb (110.7 kg)  08/07/17 242 lb (109.8 kg)  02/22/17 233 lb (105.7 kg)    Current diet: in general, a "healthy" diet    ------------------------------------------------------------------------      Allergies  Allergen Reactions  . Azithromycin Other (See Comments)    Other reaction(s): Vomiting  . Codeine Other (See Comments)    unknown  . Penicillins Rash     Current Outpatient Prescriptions:  .  amLODipine (NORVASC) 10 MG tablet, Take 1 tablet (10 mg total) by mouth daily., Disp: 30 tablet, Rfl: 2 .  Cholecalciferol (VITAMIN D) 2000 UNITS tablet, Take 2,000 Units by mouth daily., Disp: , Rfl:  .  levonorgestrel (MIRENA) 20 MCG/24HR IUD, by Intrauterine route., Disp: , Rfl:  .  metoprolol succinate (TOPROL-XL) 25 MG 24 hr tablet, Take 1 tablet (25 mg total) by mouth every morning., Disp: 90 tablet, Rfl: 1 .  MULTIPLE VITAMIN PO, Take 1 tablet by mouth daily., Disp: , Rfl:   Review of Systems    Constitutional: Negative.   HENT: Negative.   Respiratory: Negative.   Cardiovascular: Positive for leg swelling (secondary to amlodipine).  Gastrointestinal: Negative.   Genitourinary: Negative.   Musculoskeletal: Positive for arthralgias (knees and ankles with increasing exercise for weight loss).  Neurological: Negative.   Psychiatric/Behavioral: Negative.     Social History  Substance Use Topics  . Smoking status: Never Smoker  . Smokeless tobacco: Never Used  . Alcohol use Yes     Comment: very rarely   Objective:   BP 138/80 (BP Location: Left Arm, Patient Position: Sitting, Cuff Size: Large)   Pulse 76   Temp 98.2 F (36.8 C) (Oral)   Resp 16   Ht 5\' 8"  (1.727 m)   Wt 244 lb (110.7 kg)   BMI 37.10 kg/m  Vitals:   09/07/17 0844  BP: 138/80  Pulse: 76  Resp: 16  Temp: 98.2 F (36.8 C)  TempSrc: Oral  Weight: 244 lb (110.7 kg)  Height: 5\' 8"  (1.727 m)     Physical Exam  Constitutional: She appears well-developed and well-nourished. No distress.  Neck: Normal range of motion. Neck supple. No JVD present. No tracheal deviation present. No thyromegaly present.  Cardiovascular: Normal rate, regular rhythm and normal heart sounds.  Exam reveals no gallop and no friction rub.   No murmur heard. Pulmonary/Chest: Effort normal and breath sounds normal. No respiratory distress. She has no wheezes. She has no rales.  Musculoskeletal:  She exhibits no edema.  Lymphadenopathy:    She has no cervical adenopathy.  Skin: She is not diaphoretic.  Vitals reviewed.       Assessment & Plan:     1. Essential hypertension Much improved. Patient reports improvement. Having mild lower extremity edema with increase to 10mg  of amlodipine. Discussed conservative management with compression stockings, elevating legs. She is to call if swelling becomes problematic. I will see her back in 6 months. She is to call for lab slip when patient is ready to have checked. She will need all  annual labs. - amLODipine (NORVASC) 10 MG tablet; Take 1 tablet (10 mg total) by mouth daily.  Dispense: 90 tablet; Refill: 1  2. BMI 37.0-37.9, adult Discussed weight loss, calorie counting, meal planning, increasing physical activity. Discussed using epsom salt soaks after exercising to help with inflammation and using Tumeric. Patient agrees.        Mar Daring, PA-C  [facility]

## 2017-09-15 ENCOUNTER — Ambulatory Visit (INDEPENDENT_AMBULATORY_CARE_PROVIDER_SITE_OTHER): Payer: BLUE CROSS/BLUE SHIELD | Admitting: Certified Nurse Midwife

## 2017-09-15 ENCOUNTER — Encounter: Payer: Self-pay | Admitting: Certified Nurse Midwife

## 2017-09-15 VITALS — BP 150/84 | HR 108 | Ht 68.0 in | Wt 240.0 lb

## 2017-09-15 DIAGNOSIS — Z30433 Encounter for removal and reinsertion of intrauterine contraceptive device: Secondary | ICD-10-CM | POA: Diagnosis not present

## 2017-09-15 MED ORDER — LEVONORGESTREL 20 MCG/24HR IU IUD: INTRAUTERINE_SYSTEM | Freq: Once | INTRAUTERINE | 0 refills | Status: DC

## 2017-09-15 NOTE — Progress Notes (Signed)
    GYNECOLOGY OFFICE PROCEDURE NOTE  Amanda Ibarra is a 46 y.o. G1P1001 here for Mirena IUD replacement. Her current IUD was inserted 5 years ago. No GYN concerns.  Last pap smear was on 02/22/2017 and was normal.  IUD Insertion Procedure Note Patient identified, informed consent performed, consent signed.   Discussed risks of irregular bleeding, cramping, infection, expulsion,malpositioning or misplacement of the IUD outside the uterus which may require further procedure such as laparoscopy. Time out was performed.    On bimanual exam, uterus was Retroverted Speculum placed in the vagina.  Cervix visualized. IUD strings grasped with uterine packing forceps and removed easily and intact Cervix then prepped with Betadine x 3. Cervix was sprayed with Hurricaine anesthetic and  Grasped posteriorly with a single tooth tenaculum.  Uterus sounded to 7.5 cm.  Mirena  IUD placed per manufacturer's recommendations.  Strings trimmed to 2-3 cm. Tenaculum was removed.  Patient tolerated procedure well. There was minimal bleeding from the insertion.  Patient was given post-procedure instructions.   Patient was also asked to check IUD strings periodically and follow up in 4 weeks for IUD check.  Dalia Heading, CNM 09/15/17

## 2017-09-20 NOTE — Telephone Encounter (Signed)
Mirena stock was provided for this patient. 

## 2017-10-05 DIAGNOSIS — H524 Presbyopia: Secondary | ICD-10-CM | POA: Diagnosis not present

## 2017-10-13 ENCOUNTER — Ambulatory Visit (INDEPENDENT_AMBULATORY_CARE_PROVIDER_SITE_OTHER): Payer: BLUE CROSS/BLUE SHIELD | Admitting: Certified Nurse Midwife

## 2017-10-13 ENCOUNTER — Encounter: Payer: Self-pay | Admitting: Certified Nurse Midwife

## 2017-10-13 VITALS — BP 152/82 | HR 95 | Ht 68.0 in | Wt 242.0 lb

## 2017-10-13 DIAGNOSIS — T8332XA Displacement of intrauterine contraceptive device, initial encounter: Secondary | ICD-10-CM | POA: Diagnosis not present

## 2017-10-13 DIAGNOSIS — Z975 Presence of (intrauterine) contraceptive device: Secondary | ICD-10-CM | POA: Diagnosis not present

## 2017-10-13 NOTE — Progress Notes (Signed)
  History of Present Illness:  Amanda Ibarra is a 46 y.o. that had a Mirena IUD placed approximately 1 month ago. Since that time, she states that she has had a menses 2 weeks after her Mirena insertion and some spotting recently.   She denies fever and abdominal pain.  PMHx: She  has a past medical history of CIN I (cervical intraepithelial neoplasia I), Hypertension, Menorrhagia, and Vitamin D deficiency. Also,  has a past surgical history that includes [place] teeth; Colposcopy (10/09/1996); Dilation and curettage of uterus (11/2011); Cervical biopsy w/ loop electrode excision (12/12/1995); and Tonsillectomy., family history includes Arthritis/Rheumatoid in her mother; Bone cancer in her maternal grandmother; Breast cancer (age of onset: 70) in her mother; Gout in her maternal grandfather; Heart attack in her maternal grandfather; Hyperlipidemia in her mother; Hypertension in her father and mother; Thyroid disease in her mother.,  reports that  has never smoked. she has never used smokeless tobacco. She reports that she drinks alcohol. She reports that she does not use drugs.  She has a current medication list which includes the following prescription(s): amlodipine, vitamin d, levonorgestrel, metoprolol succinate, and multiple vitamin. Also, is allergic to azithromycin; codeine; and penicillins.  ROS  Physical Exam:  BP (!) 152/82   Pulse 95   Ht 5\' 8"  (1.727 m)   Wt 109.8 kg (242 lb)   LMP 09/27/2017 (Exact Date)   BMI 36.80 kg/m  Constitutional: Well nourished, well developed female in no acute distress.  Abdomen: diffusely non tender to palpation, non distended, and no masses, hernias Neuro: Grossly intact Psych:  Normal mood and affect.    Pelvic exam: External/BUS: no lesion, no discharge Vagina: no lesions, small amount of bleeding Cervix: IUD strings not seen coming from the cervical os.   Assessment: IUD strings missing  Plan: An ultrasound by this provider and by Melene Muller ultrasonographer was performed. The IUD was seen in the endometrium, at the fundus. Strings were seen in the cervix. Patient was reassured that the IUD was in the correct positon.  Patient to return in 1 year for annual' Patient's blood pressure is elevated-she did not take her BP medication this morning.  Dalia Heading, CNM

## 2017-11-08 ENCOUNTER — Encounter: Payer: Self-pay | Admitting: Physician Assistant

## 2017-11-08 ENCOUNTER — Ambulatory Visit: Payer: BLUE CROSS/BLUE SHIELD | Admitting: Physician Assistant

## 2017-11-08 VITALS — BP 168/80 | HR 108 | Temp 99.6°F | Resp 16 | Wt 243.4 lb

## 2017-11-08 DIAGNOSIS — R05 Cough: Secondary | ICD-10-CM

## 2017-11-08 DIAGNOSIS — R6889 Other general symptoms and signs: Secondary | ICD-10-CM

## 2017-11-08 DIAGNOSIS — R059 Cough, unspecified: Secondary | ICD-10-CM

## 2017-11-08 LAB — POCT INFLUENZA A/B
Influenza A, POC: NEGATIVE
Influenza B, POC: NEGATIVE

## 2017-11-08 MED ORDER — PREDNISONE 10 MG (21) PO TBPK: ORAL_TABLET | ORAL | 0 refills | Status: DC

## 2017-11-08 MED ORDER — HYDROCODONE-HOMATROPINE 5-1.5 MG/5ML PO SYRP: 5.0000 mL | ORAL_SOLUTION | Freq: Three times a day (TID) | ORAL | 0 refills | Status: DC | PRN

## 2017-11-08 NOTE — Patient Instructions (Signed)

## 2017-11-08 NOTE — Progress Notes (Signed)
Patient: Amanda Ibarra Female    DOB: 02-Jul-1971   47 y.o.   MRN: [id number] Visit Date: 11/08/2017  Today's Provider: Mar Daring, PA-C   Chief Complaint  Patient presents with  . URI   Subjective:    URI   This is a new problem. The current episode started yesterday. The problem has been gradually worsening. There has been no fever (Mild temperature). Associated symptoms include coughing, headaches (a little) and a plugged ear sensation. Pertinent negatives include no chest pain, diarrhea, ear pain, nausea, sinus pain, vomiting or wheezing. She has tried acetaminophen and decongestant (Mucinex-DM) for the symptoms. The treatment provided no relief.      Allergies  Allergen Reactions  . Azithromycin Other (See Comments)    Other reaction(s): Vomiting  . Codeine Other (See Comments)    unknown  . Penicillins Rash     Current Outpatient Medications:  .  amLODipine (NORVASC) 10 MG tablet, Take 1 tablet (10 mg total) by mouth daily., Disp: 90 tablet, Rfl: 1 .  Cholecalciferol (VITAMIN D) 2000 UNITS tablet, Take 2,000 Units by mouth daily., Disp: , Rfl:  .  metoprolol succinate (TOPROL-XL) 25 MG 24 hr tablet, Take 1 tablet (25 mg total) by mouth every morning., Disp: 90 tablet, Rfl: 1 .  MULTIPLE VITAMIN PO, Take 1 tablet by mouth daily., Disp: , Rfl:  .  levonorgestrel (MIRENA) 20 MCG/24HR IUD, once for 1 dose by Intrauterine route., Disp: 1 each, Rfl: 0  Review of Systems  Constitutional: Positive for chills. Unexpected weight change: mild temperature.  HENT: Positive for postnasal drip and trouble swallowing. Negative for ear pain, sinus pressure and sinus pain.   Respiratory: Positive for cough. Negative for chest tightness, shortness of breath and wheezing.   Cardiovascular: Negative for chest pain.  Gastrointestinal: Negative for diarrhea, nausea and vomiting.  Musculoskeletal:       "Like a fever muscle ache"  Neurological: Positive for headaches (a  little).    Social History   Tobacco Use  . Smoking status: Never Smoker  . Smokeless tobacco: Never Used  Substance Use Topics  . Alcohol use: Yes    Comment: very rarely   Objective:   BP (!) 168/80 (BP Location: Left Arm, Patient Position: Sitting, Cuff Size: Normal)   Pulse (!) 108   Temp 99.6 F (37.6 C) (Oral)   Resp 16   Wt 243 lb 6.4 oz (110.4 kg)   SpO2 97%   BMI 37.01 kg/m    Physical Exam  Constitutional: She appears well-developed and well-nourished. No distress.  HENT:  Head: Normocephalic and atraumatic.  Right Ear: Hearing, tympanic membrane, external ear and ear canal normal.  Left Ear: Hearing, tympanic membrane, external ear and ear canal normal.  Nose: Nose normal.  Mouth/Throat: Uvula is midline, oropharynx is clear and moist and mucous membranes are normal. No oropharyngeal exudate.  Eyes: Conjunctivae are normal. Pupils are equal, round, and reactive to light. Right eye exhibits no discharge. Left eye exhibits no discharge. No scleral icterus.  Neck: Normal range of motion. Neck supple. No tracheal deviation present. No thyromegaly present.  Cardiovascular: Normal rate, regular rhythm and normal heart sounds. Exam reveals no gallop and no friction rub.  No murmur heard. Pulmonary/Chest: Effort normal and breath sounds normal. No stridor. No respiratory distress. She has no wheezes. She has no rales.  Lymphadenopathy:    She has no cervical adenopathy.  Skin: Skin is warm and dry. She is  not diaphoretic.  Vitals reviewed.      Assessment & Plan:     1. Flu-like symptoms Flu negative. Continue symptomatic OTC medications.  - POCT Influenza A/B  2. Cough Will give prednisone and Tussionex cough syrup as below for nighttime cough. Continue Mucinex DM for cough and congestion during the day. These were given as a hard script for patient to fill if cough and chest congestion worsen.  - predniSONE (STERAPRED UNI-PAK 21 TAB) 10 MG (21) TBPK tablet; 6  day taper; take as directed on package instructions  Dispense: 21 tablet; Refill: 0 - HYDROcodone-homatropine (HYCODAN) 5-1.5 MG/5ML syrup; Take 5 mLs by mouth every 8 (eight) hours as needed for cough.  Dispense: 120 mL; Refill: 0       Mar Daring, PA-C  [facility]

## 2017-11-25 ENCOUNTER — Encounter: Payer: Self-pay | Admitting: Physician Assistant

## 2017-11-25 ENCOUNTER — Ambulatory Visit: Payer: BLUE CROSS/BLUE SHIELD | Admitting: Physician Assistant

## 2017-11-25 VITALS — BP 140/90 | HR 94 | Temp 98.9°F | Resp 16 | Wt 247.0 lb

## 2017-11-25 DIAGNOSIS — B029 Zoster without complications: Secondary | ICD-10-CM

## 2017-11-25 MED ORDER — VALACYCLOVIR HCL 500 MG PO TABS: 500.0000 mg | ORAL_TABLET | Freq: Three times a day (TID) | ORAL | 0 refills | Status: DC

## 2017-11-25 NOTE — Progress Notes (Signed)
Patient: Amanda Ibarra Female    DOB: Apr 23, 1971   47 y.o.   MRN: [id number] Visit Date: 11/25/2017  Today's Provider: Mar Daring, PA-C   Chief Complaint  Patient presents with  . Back Pain   Subjective:    Back Pain  This is a new problem. Episode onset: 3 days ago. The problem occurs constantly. The problem has been gradually worsening since onset. Pain location: upper right side of her back. The quality of the pain is described as aching (hot,stabbing, sharp pain). Worse during: worse in the mornings. Pertinent negatives include no abdominal pain, chest pain, fever or weakness. She has tried heat and ice (also tens unit) for the symptoms. The treatment provided mild relief.  Patient thinks she may have Shingles. She does not have a rash but, has had shingles in the past that felt similar to what she is experiencing now.     Allergies  Allergen Reactions  . Azithromycin Other (See Comments)    Other reaction(s): Vomiting  . Codeine Other (See Comments)    unknown  . Penicillins Rash     Current Outpatient Medications:  .  amLODipine (NORVASC) 10 MG tablet, Take 1 tablet (10 mg total) by mouth daily., Disp: 90 tablet, Rfl: 1 .  Cholecalciferol (VITAMIN D) 2000 UNITS tablet, Take 2,000 Units by mouth daily., Disp: , Rfl:  .  metoprolol succinate (TOPROL-XL) 25 MG 24 hr tablet, Take 1 tablet (25 mg total) by mouth every morning., Disp: 90 tablet, Rfl: 1 .  MULTIPLE VITAMIN PO, Take 1 tablet by mouth daily., Disp: , Rfl:  .  HYDROcodone-homatropine (HYCODAN) 5-1.5 MG/5ML syrup, Take 5 mLs by mouth every 8 (eight) hours as needed for cough. (Patient not taking: Reported on 11/25/2017), Disp: 120 mL, Rfl: 0 .  levonorgestrel (MIRENA) 20 MCG/24HR IUD, once for 1 dose by Intrauterine route., Disp: 1 each, Rfl: 0 .  predniSONE (STERAPRED UNI-PAK 21 TAB) 10 MG (21) TBPK tablet, 6 day taper; take as directed on package instructions (Patient not taking: Reported on  11/25/2017), Disp: 21 tablet, Rfl: 0  Review of Systems  Constitutional: Negative for appetite change, chills, fatigue and fever.  Respiratory: Negative for chest tightness and shortness of breath.   Cardiovascular: Negative for chest pain and palpitations.  Gastrointestinal: Negative for abdominal pain, nausea and vomiting.  Musculoskeletal: Positive for back pain.  Neurological: Negative for dizziness and weakness.    Social History   Tobacco Use  . Smoking status: Never Smoker  . Smokeless tobacco: Never Used  Substance Use Topics  . Alcohol use: Yes    Comment: very rarely   Objective:   BP 140/90 (BP Location: Left Arm, Patient Position: Sitting, Cuff Size: Large)   Pulse 94   Temp 98.9 F (37.2 C) (Oral)   Resp 16   Wt 247 lb (112 kg)   SpO2 98% Comment: room air  BMI 37.56 kg/m  Vitals:   11/25/17 0950  Weight: 247 lb (112 kg)     Physical Exam  Constitutional: She appears well-developed and well-nourished.  Pulmonary/Chest: No respiratory distress.  Skin: Skin is warm and dry. No rash noted.  Vitals reviewed.      Assessment & Plan:     1. Herpes zoster without complication Rash not present yet but pain in dermatome consistent with prodrome of shingles. Valtrex given as below. She is to call if symptoms fail to improve or worsen. - valACYclovir (VALTREX) 500 MG tablet; Take  1 tablet (500 mg total) by mouth 3 (three) times daily.  Dispense: 21 tablet; Refill: 0       Mar Daring, PA-C  [facility]

## 2017-11-25 NOTE — Patient Instructions (Signed)
Shingles Shingles, which is also known as herpes zoster, is an infection that causes a painful skin rash and fluid-filled blisters. Shingles is not related to genital herpes, which is a sexually transmitted infection. Shingles only develops in people who:  Have had chickenpox.  Have received the chickenpox vaccine. (This is rare.)  What are the causes? Shingles is caused by varicella-zoster virus (VZV). This is the same virus that causes chickenpox. After exposure to VZV, the virus stays in the body in an inactive (dormant) state. Shingles develops if the virus reactivates. This can happen many years after the initial exposure to VZV. It is not known what causes this virus to reactivate. What increases the risk? People who have had chickenpox or received the chickenpox vaccine are at risk for shingles. Infection is more common in people who:  Are older than age 50.  Have a weakened defense (immune) system, such as those with HIV, AIDS, or cancer.  Are taking medicines that weaken the immune system, such as transplant medicines.  Are under great stress.  What are the signs or symptoms? Early symptoms of this condition include itching, tingling, and pain in an area on your skin. Pain may be described as burning, stabbing, or throbbing. A few days or weeks after symptoms start, a painful red rash appears, usually on one side of the body in a bandlike or beltlike pattern. The rash eventually turns into fluid-filled blisters that break open, scab over, and dry up in about 2-3 weeks. At any time during the infection, you may also develop:  A fever.  Chills.  A headache.  An upset stomach.  How is this diagnosed? This condition is diagnosed with a skin exam. Sometimes, skin or fluid samples are taken from the blisters before a diagnosis is made. These samples are examined under a microscope or sent to a lab for testing. How is this treated? There is no specific cure for this condition.  Your health care provider will probably prescribe medicines to help you manage pain, recover more quickly, and avoid long-term problems. Medicines may include:  Antiviral drugs.  Anti-inflammatory drugs.  Pain medicines.  If the area involved is on your face, you may be referred to a specialist, such as an eye doctor (ophthalmologist) or an ear, nose, and throat (ENT) doctor to help you avoid eye problems, chronic pain, or disability. Follow these instructions at home: Medicines  Take medicines only as directed by your health care provider.  Apply an anti-itch or numbing cream to the affected area as directed by your health care provider. Blister and Rash Care  Take a cool bath or apply cool compresses to the area of the rash or blisters as directed by your health care provider. This may help with pain and itching.  Keep your rash covered with a loose bandage (dressing). Wear loose-fitting clothing to help ease the pain of material rubbing against the rash.  Keep your rash and blisters clean with mild soap and cool water or as directed by your health care provider.  Check your rash every day for signs of infection. These include redness, swelling, and pain that lasts or increases.  Do not pick your blisters.  Do not scratch your rash. General instructions  Rest as directed by your health care provider.  Keep all follow-up visits as directed by your health care provider. This is important.  Until your blisters scab over, your infection can cause chickenpox in people who have never had it or been vaccinated   against it. To prevent this from happening, avoid contact with other people, especially: ? Babies. ? Pregnant women. ? Children who have eczema. ? Elderly people who have transplants. ? People who have chronic illnesses, such as leukemia or AIDS. Contact a health care provider if:  Your pain is not relieved with prescribed medicines.  Your pain does not get better after  the rash heals.  Your rash looks infected. Signs of infection include redness, swelling, and pain that lasts or increases. Get help right away if:  The rash is on your face or nose.  You have facial pain, pain around your eye area, or loss of feeling on one side of your face.  You have ear pain or you have ringing in your ear.  You have loss of taste.  Your condition gets worse. This information is not intended to replace advice given to you by your health care provider. Make sure you discuss any questions you have with your health care provider. Document Released: 10/24/2005 Document Revised: 06/19/2016 Document Reviewed: 09/04/2014 Elsevier Interactive Patient Education  2018 Elsevier Inc.  

## 2017-11-29 ENCOUNTER — Telehealth: Payer: Self-pay | Admitting: Physician Assistant

## 2017-11-29 NOTE — Telephone Encounter (Signed)
Patient was not taking the Ibuprofen and Tylenol as frequently as every 3 hours.  She is going to increase frequency and see if it helps.  After a day or two if no relief she will let us know to call in something.

## 2017-11-29 NOTE — Telephone Encounter (Signed)
Thank you :)

## 2017-11-29 NOTE — Telephone Encounter (Signed)
Patient diagnosed with shingles on Saturday.   Rash has popped out on Sunday night and she is experiencing lots of pain. She is taking 500 mg. Of Tylenol and 400 mg. of Ibuprofen 3 times a day.  She can bear the pain while on this but when it wears off, she is in so much more pain. Morning and night is very hard on her with pain.  She does not handle hydrocodone very well and needs to work. Makes her "super sleepy".

## 2017-11-29 NOTE — Telephone Encounter (Signed)
Is she still experiencing new breakouts with the antiviral? If so I may send in new Rx to lengthen therapy.   Also it is safe for her to continue taking IBU and tylenol every 3 hrs alternating if this is working. All other meds are going to have drowsiness as a side effect unfortunately. We normally use gabapentin for the nerve pain initially but its #1 side effect is drowsiness. I can send in if patient still wishes to try.

## 2017-12-06 ENCOUNTER — Telehealth: Payer: Self-pay | Admitting: Physician Assistant

## 2017-12-06 DIAGNOSIS — B0229 Other postherpetic nervous system involvement: Secondary | ICD-10-CM

## 2017-12-06 MED ORDER — GABAPENTIN 100 MG PO CAPS: 100.0000 mg | ORAL_CAPSULE | Freq: Three times a day (TID) | ORAL | 0 refills | Status: DC

## 2017-12-06 NOTE — Telephone Encounter (Signed)
Sent in

## 2017-12-06 NOTE — Telephone Encounter (Signed)
Pt's husband Quita Skye is requesting a Rx for gabapentin to help with the pain from shingles that pt was Dx with on 11/25/17. Adam stated pt's pain stays at a 7 or 8 and a nurse suggested pt try gabapentin. Pharmacy: [facility]  Please advise. Thanks TNP

## 2017-12-06 NOTE — Telephone Encounter (Signed)
Please review. Ok to fill?

## 2018-03-09 ENCOUNTER — Ambulatory Visit (INDEPENDENT_AMBULATORY_CARE_PROVIDER_SITE_OTHER): Payer: BLUE CROSS/BLUE SHIELD | Admitting: Certified Nurse Midwife

## 2018-03-09 ENCOUNTER — Encounter: Payer: Self-pay | Admitting: Certified Nurse Midwife

## 2018-03-09 VITALS — BP 146/68 | HR 116 | Ht 68.0 in | Wt 239.0 lb

## 2018-03-09 DIAGNOSIS — Z1239 Encounter for other screening for malignant neoplasm of breast: Secondary | ICD-10-CM

## 2018-03-09 DIAGNOSIS — Z1231 Encounter for screening mammogram for malignant neoplasm of breast: Secondary | ICD-10-CM

## 2018-03-09 DIAGNOSIS — Z124 Encounter for screening for malignant neoplasm of cervix: Secondary | ICD-10-CM

## 2018-03-09 DIAGNOSIS — Z23 Encounter for immunization: Secondary | ICD-10-CM | POA: Diagnosis not present

## 2018-03-09 DIAGNOSIS — Z01419 Encounter for gynecological examination (general) (routine) without abnormal findings: Secondary | ICD-10-CM

## 2018-03-09 MED ORDER — ALBUTEROL SULFATE HFA 108 (90 BASE) MCG/ACT IN AERS: 2.0000 | INHALATION_SPRAY | Freq: Four times a day (QID) | RESPIRATORY_TRACT | 0 refills | Status: DC | PRN

## 2018-03-09 MED ORDER — CETIRIZINE HCL 10 MG PO TABS: 10.0000 mg | ORAL_TABLET | Freq: Every day | ORAL | 2 refills | Status: DC

## 2018-03-09 NOTE — Progress Notes (Signed)
Gynecology Annual Exam  PCP: Mar Daring, PA-C  Chief Complaint:  Chief Complaint  Patient presents with  . Gynecologic Exam    Requesting TDAP  . Chest congestion    History of Present Illness: Amanda Ibarra. Amanda Ibarra is a 47 year old Caucasian/White female, G1 P1001, who presents for her annual exam. She has concerns regarding chest congestion and a wheezing that she hears on expiration. She has had an URI for the past week. Taking Mucinex maximum strength, Tylenol and Coricidan HBP.  No fever currently. Cough is productive for clear to white mucous. She also desires a tetanus vaccine as she is going on a cruise in the near future. Her menses are regular. They occur every month and last 4-5 days and are spotting to a light flow.   She reports mild dysmenorrhea not usually requiring analgesics most of the time. Takes ibuprofen for cramping maybe twice a year. The patient's past medical history is notable for a history of hypertension, kidney stones,  and menorrhagia. In 2013 she had a D&C and hysteroscopy for menorrhagia and path revealed polypoid tissue. She had a Mirena inserted 07/18/2012 for tx of menorrhagia and it was replaced in November 2018. THe following month a ultrasound was done when the IUD strings were not visible and the IUD was noted to be in the correct location. 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 Since her last annual GYN exam dated 02/22/2017, she has had no other changes in her health. She is sexually active. She is currently using an IUD for contraception.  Her most recent pap smear was obtained 02/22/2017 and was NIL/negative HRHPV.  Her most recent mammogram obtained on 03/01/2017 was normal and revealed no significant changes.  There is a family history of breast cancer in her mother and genetic testing has not been done. There is no family history of ovarian cancer.  The patient does do monthly self breast exams.    The patient does not smoke.  The patient does drink occasionally.  The patient does not use illegal drugs.  The patient is just getting back to exercising regularly and has lost 8# in the last 5-6 months. The patient does get adequate calcium in her diet.  She has  had a recent cholesterol screen at PCP 07/21/2016 and it was borderline.    Review of Systems: Review of Systems  Constitutional: Negative for chills, fever and weight loss.  HENT: Negative for congestion, sinus pain and sore throat.   Eyes: Negative for blurred vision and pain.  Respiratory: Positive for cough, sputum production and wheezing. Negative for hemoptysis and shortness of breath.   Cardiovascular: Negative for chest pain, palpitations and leg swelling.  Gastrointestinal: Negative for abdominal pain (left groin), blood in stool, diarrhea, heartburn, nausea and vomiting.  Genitourinary: Negative for dysuria, frequency, hematuria and urgency.  Musculoskeletal: Negative for back pain, joint pain (right ankle) and myalgias.  Skin: Negative for itching and rash.  Neurological: Negative for dizziness, tingling and headaches.  Endo/Heme/Allergies: Negative for environmental allergies and polydipsia. Does not bruise/bleed easily.       Negative for hirsutism   Psychiatric/Behavioral: Negative for depression. The patient is not nervous/anxious and does not have insomnia.     Past Medical History:  Past Medical History:  Diagnosis Date  . CIN I (cervical intraepithelial neoplasia I)   . Hypertension   . Menorrhagia   . Vitamin D deficiency  Past Surgical History:  Past Surgical History:  Procedure Laterality Date  . CERVICAL BIOPSY  W/ LOOP ELECTRODE EXCISION  12/12/1995  . COLPOSCOPY  10/09/1996  . DILATION AND CURETTAGE OF UTERUS  11/2011   for menorrhagia  . TONSILLECTOMY    . Wisdon teeth      Family History:  Family History  Problem Relation Age of Onset  . Hypertension Father   .  Arthritis/Rheumatoid Mother   . Hyperlipidemia Mother   . Hypertension Mother   . Thyroid disease Mother   . Breast cancer Mother 9  . Bone cancer Maternal Grandmother   . Heart attack Maternal Grandfather   . Gout Maternal Grandfather     Social History:  Social History   Socioeconomic History  . Marital status: Married    Spouse name: Not on file  . Number of children: 1  . Years of education: Bachelors  . Highest education level: Not on file  Occupational History    Employer: [employer]    Comment: Full Time  Social Needs  . Financial resource strain: Not on file  . Food insecurity:    Worry: Not on file    Inability: Not on file  . Transportation needs:    Medical: Not on file    Non-medical: Not on file  Tobacco Use  . Smoking status: Never Smoker  . Smokeless tobacco: Never Used  Substance and Sexual Activity  . Alcohol use: Yes    Comment: very rarely  . Drug use: No  . Sexual activity: Yes    Partners: Male    Birth control/protection: IUD  Lifestyle  . Physical activity:    Days per week: 4 days    Minutes per session: 30 min  . Stress: Only a little  Relationships  . Social connections:    Talks on phone: More than three times a week    Gets together: More than three times a week    Attends religious service: More than 4 times per year    Active member of club or organization: Patient refused    Attends meetings of clubs or organizations: Patient refused    Relationship status: Married  . Intimate partner violence:    Fear of current or ex partner: Patient refused    Emotionally abused: Patient refused    Physically abused: Patient refused    Forced sexual activity: Patient refused  Other Topics Concern  . Not on file  Social History Narrative  . Not on file    Allergies:  Allergies  Allergen Reactions  . Azithromycin Other (See Comments)    Other reaction(s): Vomiting  . Codeine Other (See Comments)    unknown  . Penicillins  Rash    Medications: Prior to Admission medications   Medication Sig Start Date End Date Taking? Authorizing Provider  amLODipine (NORVASC) 5 MG tablet Take 1 tablet (5 mg total) by mouth daily. 07/21/16   Mar Daring, PA-C  Cholecalciferol (VITAMIN D) 2000 UNITS tablet Take 2,000 Units by mouth daily.    Historical Provider, MD  levonorgestrel (MIRENA) 20 MCG/24HR IUD by Intrauterine route.    Historical Provider, MD  metoprolol succinate (TOPROL-XL) 25 MG 24 hr tablet TAKE 1 TABLET BY MOUTH EVERY MORNING 01/31/17   Mar Daring, PA-C  MULTIPLE VITAMIN PO Take 1 tablet by mouth daily.    Historical Provider, MD   And takes Tumeric daily.  Physical Exam Vitals: Blood pressure (!) 142/82, pulse (!) 113, height 5\' 8"  (  1.727 m), weight 233 lb (105.7 kg), last menstrual period 02/16/2017.  General: WF in NAD HEENT: normocephalic, anicteric Thyroid: no enlargement, no palpable nodules Pulmonary: No increased work of breathing, CTAB but can hear wheezing without stethoscope with forceful expiration  Cardiovascular: tachycardia, with sinus arrhythmia without murmur Breast: Breast symmetrical, no tenderness, no palpable nodules or masses, no skin or nipple retraction present, no nipple discharge.  No axillary, infraclavicular, or supraclavicular lymphadenopathy. Abdomen: soft, non-tender, non-distended, obese.  Umbilicus without lesions.  No hepatomegaly or masses palpable. No evidence of hernia  Genitourinary:  External: Normal external female genitalia.  Normal urethral meatus, normal Bartholin's and Skene's glands.    Vagina: Normal vaginal mucosa, no evidence of prolapse.    Cervix: Grossly normal in appearance, no bleeding, IUD strings not visible, no CMT  Uterus: Retroflexed, decreased mobility, NT, NSSC  Adnexa:  no adnexal masses, NT  Rectal: deferred  Lymphatic: no evidence of inguinal lymphadenopathy Extremities: no edema, erythema, or tenderness Neurologic: Grossly  intact Psychiatric: mood appropriate, affect full      Assessment: 47 y.o. G1P1001 well woman exam Reactive airway from URI  Recommending plain Mucinex, and will prescribe Zyrtec and Ventolin inhaler  Follow up with PCP if sx persist IUD strings not visible, but with a recent ultrasound showing the IUD in place. Desires tetanus vaccine (>10 years since last) Plan:   1) Mammogram - recommend yearly screening mammogram. Patient to schedule screening mammogram at [facility]  2) Pap done  3) Contraception- Mirena   4) Routine healthcare maintenance including cholesterol, diabetes screening  managed by PCP  TDAP given today  5) RTO in one year for her annual exam.  Dalia Heading, CNM

## 2018-03-14 ENCOUNTER — Other Ambulatory Visit: Payer: Self-pay | Admitting: Physician Assistant

## 2018-03-14 ENCOUNTER — Ambulatory Visit
Admission: RE | Admit: 2018-03-14 | Discharge: 2018-03-14 | Disposition: A | Discharge status: Home / Self Care | Payer: BLUE CROSS/BLUE SHIELD | Source: Ambulatory Visit | Attending: Certified Nurse Midwife | Admitting: Certified Nurse Midwife

## 2018-03-14 DIAGNOSIS — Z1239 Encounter for other screening for malignant neoplasm of breast: Secondary | ICD-10-CM

## 2018-03-14 DIAGNOSIS — I10 Essential (primary) hypertension: Secondary | ICD-10-CM

## 2018-03-14 DIAGNOSIS — Z1231 Encounter for screening mammogram for malignant neoplasm of breast: Secondary | ICD-10-CM | POA: Diagnosis not present

## 2018-03-14 LAB — IGP,RFX APTIMA HPV ALL PTH: PAP Smear Comment: 0

## 2018-03-16 ENCOUNTER — Telehealth: Payer: Self-pay

## 2018-03-16 NOTE — Telephone Encounter (Signed)
Pt calling to make sure that pap smear was normal . I advised pt it was normal.

## 2018-05-14 IMAGING — MG MM DIGITAL SCREENING BILAT W/ TOMO W/ CAD
8 of 12 series · 8 of 28 positions shown · non-contrast
Comparison: Previous exam(s).

CLINICAL DATA: Screening.

EXAM:
2D DIGITAL SCREENING BILATERAL MAMMOGRAM WITH CAD AND ADJUNCT TOMO

[L MLO synth-2D]
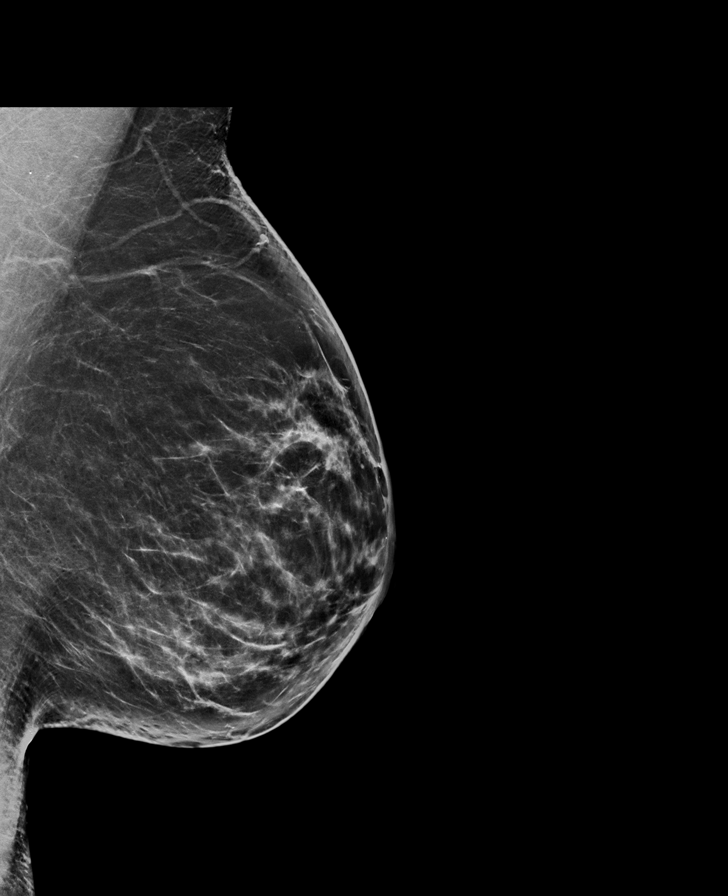

[L MLO]
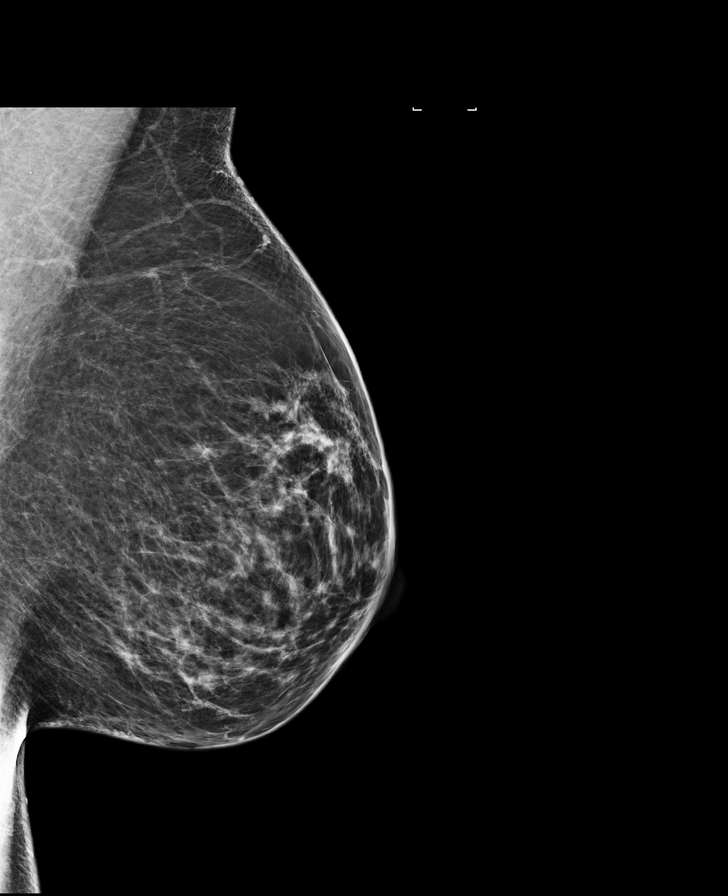

[L CC synth-2D]
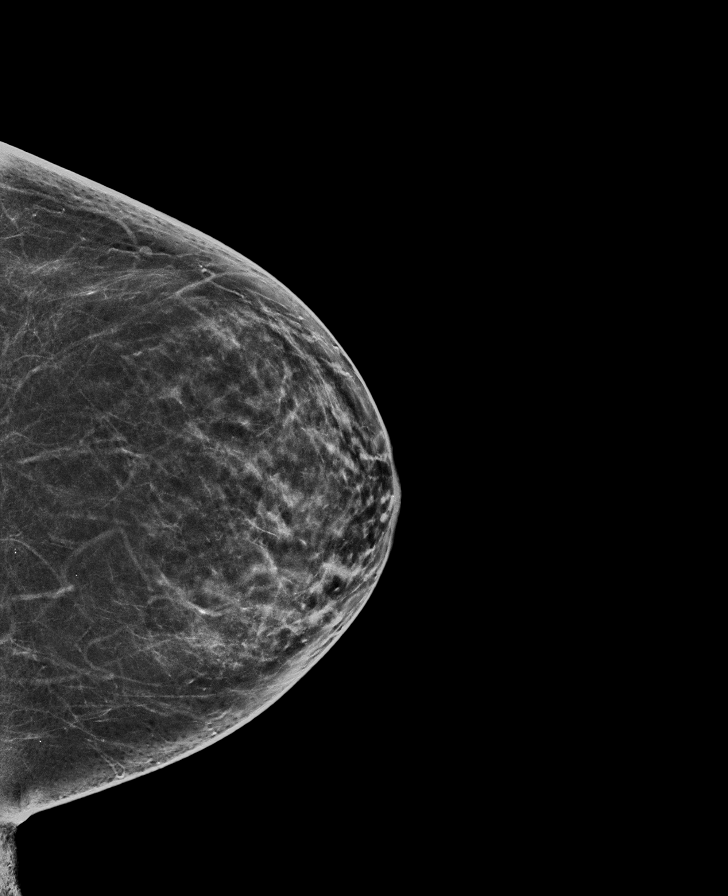

[R MLO]
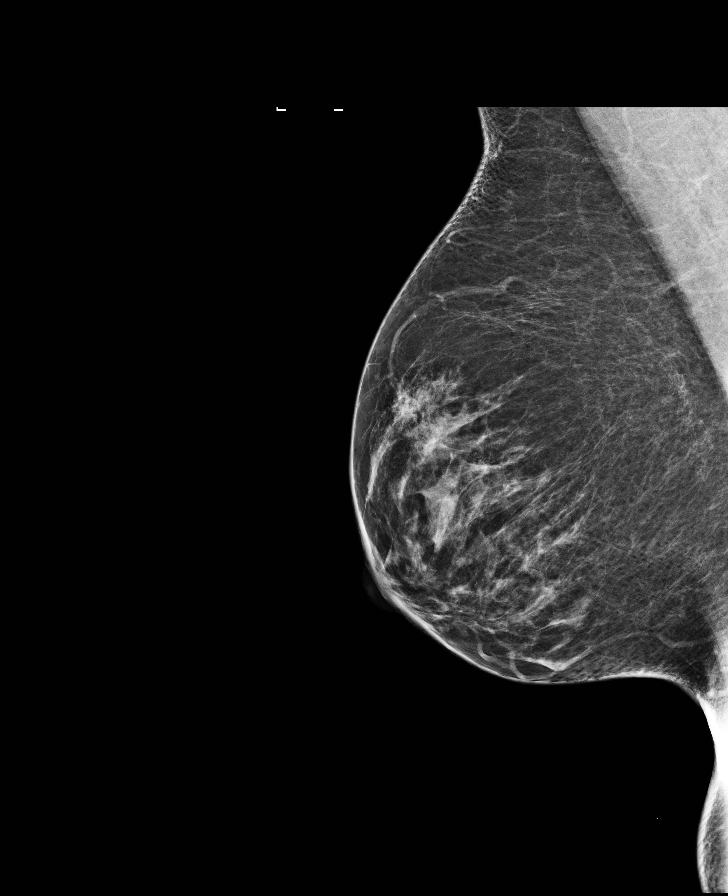

[R CC]
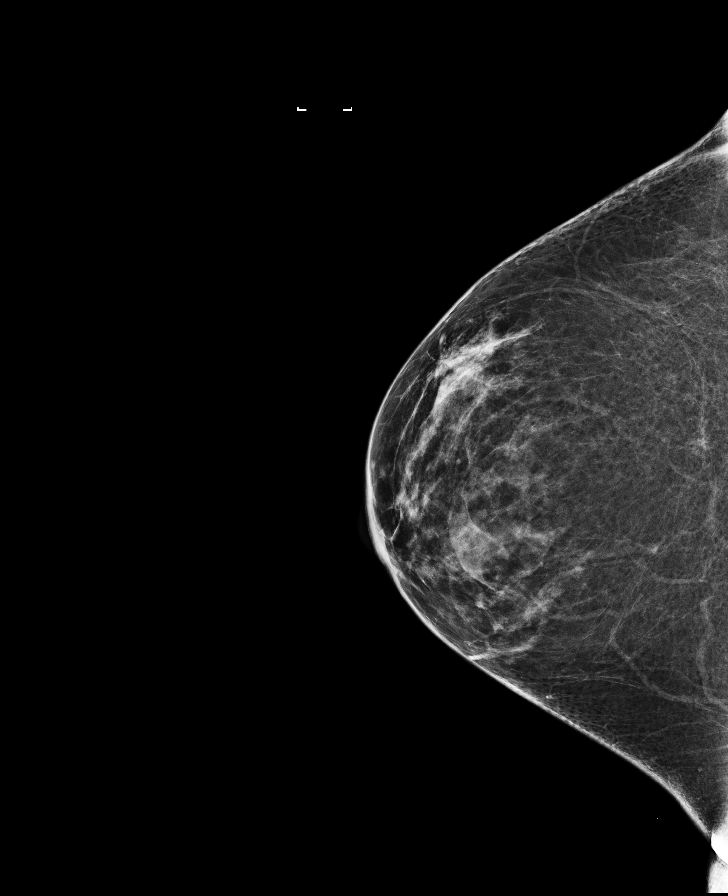

[R MLO synth-2D]
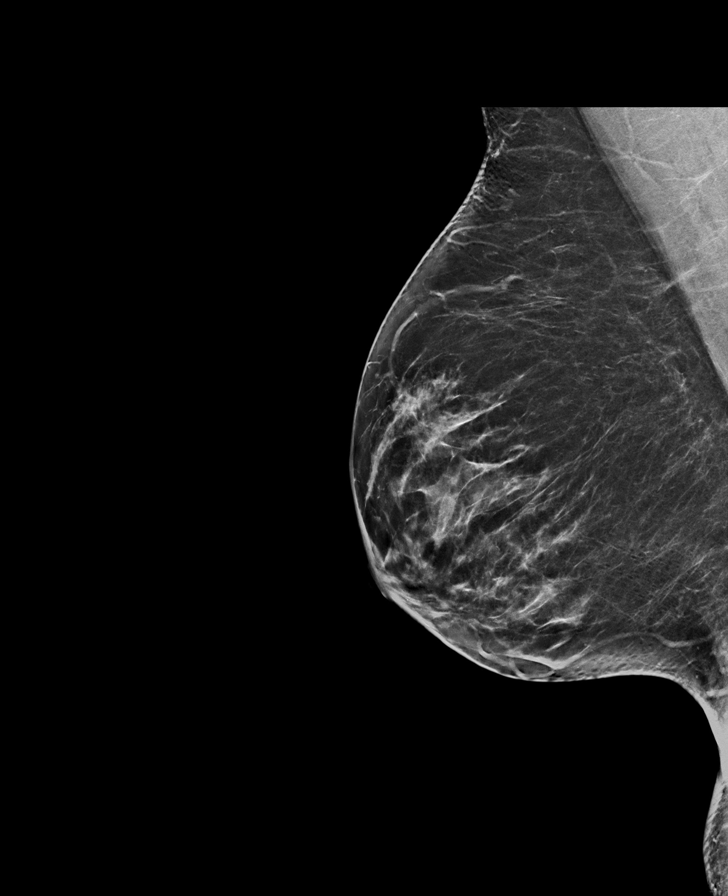

[R CC synth-2D]
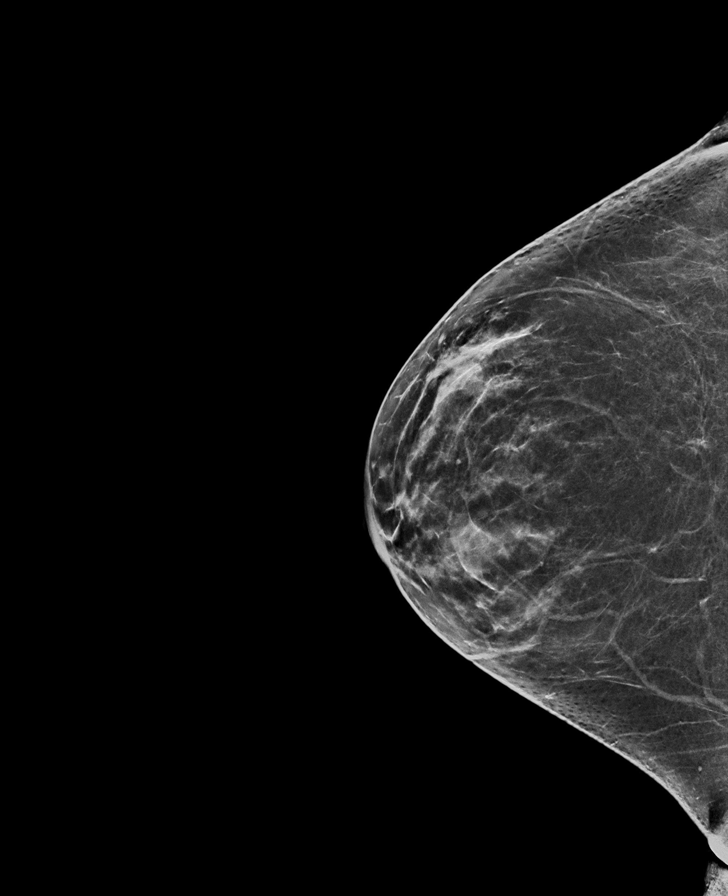

[L CC]
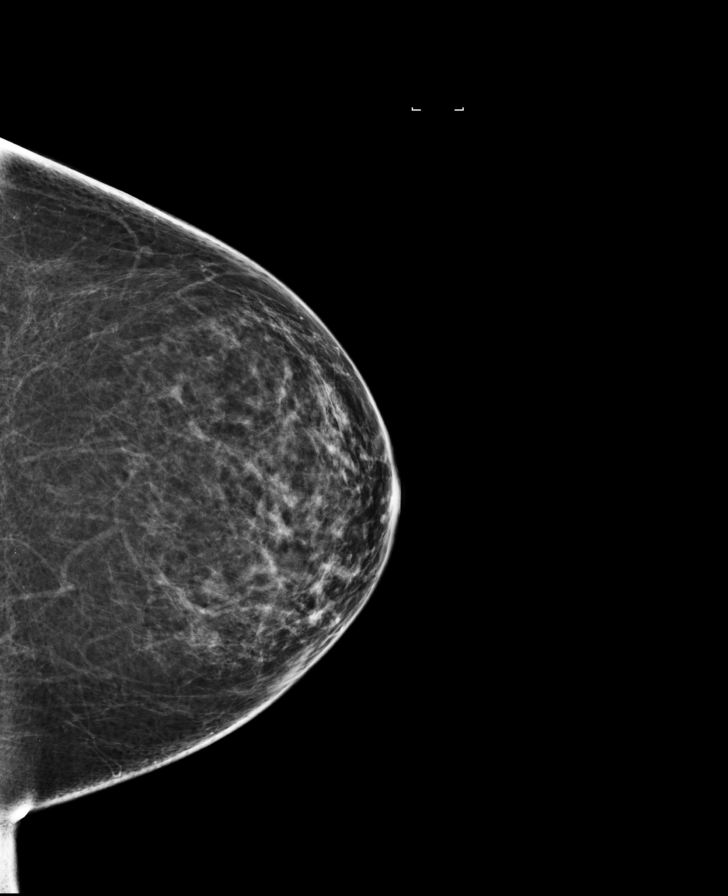

[8 of 28 positions shown; findings below may reference images not displayed]

ACR Breast Density Category b: There are scattered areas of
fibroglandular density.
FINDINGS: There are no findings suspicious for malignancy. Images were
processed with CAD.
IMPRESSION: No mammographic evidence of malignancy. A result letter of this
screening mammogram will be mailed directly to the patient.

RECOMMENDATION:
Screening mammogram in one year. (Code:97-6-RS4)

BI-RADS CATEGORY  1: Negative.

## 2018-06-11 ENCOUNTER — Other Ambulatory Visit: Payer: Self-pay | Admitting: Physician Assistant

## 2018-06-11 DIAGNOSIS — I10 Essential (primary) hypertension: Secondary | ICD-10-CM

## 2018-09-27 ENCOUNTER — Encounter: Payer: Self-pay | Admitting: Physician Assistant

## 2018-09-27 ENCOUNTER — Ambulatory Visit: Payer: BLUE CROSS/BLUE SHIELD | Admitting: Physician Assistant

## 2018-09-27 VITALS — BP 148/96 | HR 72 | Temp 98.2°F | Resp 16 | Ht 68.0 in | Wt 230.0 lb

## 2018-09-27 DIAGNOSIS — Z114 Encounter for screening for human immunodeficiency virus [HIV]: Secondary | ICD-10-CM

## 2018-09-27 DIAGNOSIS — I10 Essential (primary) hypertension: Secondary | ICD-10-CM

## 2018-09-27 MED ORDER — LISINOPRIL 10 MG PO TABS: 10.0000 mg | ORAL_TABLET | Freq: Every day | ORAL | 0 refills | Status: DC

## 2018-09-27 NOTE — Progress Notes (Signed)
Acute Office Visit  Subjective:    Patient ID: Amanda Ibarra, female    DOB: 05-20-1971, 47 y.o.   MRN: [id number]  Chief Complaint  Patient presents with  . Hypertension    HPI Patient is in today for elevation of blood pressure in the last 2 weeks. Patient reports dizziness, bluried vision and denies any chest pain or swelling of lower legs. Denies persistent vision loss, imbalance, worst headache of her life. Patient reports symptoms are on and off in the last 2 weeks. Reports eyes out of focus and when she looks down it does clear up. She is on 10 mg amlodipine and metoprolol succinate 25 mg once every morning. She has an IUD in place and denies pregnancy.   BP Readings from Last 3 Encounters:  09/27/18 (!) 148/96  03/09/18 (!) 146/68  11/25/17 140/90     Past Medical History:  Diagnosis Date  . CIN I (cervical intraepithelial neoplasia I)   . Hypertension   . Menorrhagia   . Vitamin D deficiency     Past Surgical History:  Procedure Laterality Date  . CERVICAL BIOPSY  W/ LOOP ELECTRODE EXCISION  12/12/1995  . COLPOSCOPY  10/09/1996  . DILATION AND CURETTAGE OF UTERUS  11/2011   for menorrhagia  . TONSILLECTOMY    . Wisdon teeth      Family History  Problem Relation Age of Onset  . Hypertension Father   . Arthritis/Rheumatoid Mother   . Hyperlipidemia Mother   . Hypertension Mother   . Thyroid disease Mother   . Breast cancer Mother 62  . Bone cancer Maternal Grandmother   . Heart attack Maternal Grandfather   . Gout Maternal Grandfather     Social History   Socioeconomic History  . Marital status: Married    Spouse name: Not on file  . Number of children: 1  . Years of education: Bachelors  . Highest education level: Not on file  Occupational History    Employer: [employer]    Comment: Full Time  Social Needs  . Financial resource strain: Not on file  . Food insecurity:    Worry: Not on file    Inability: Not on file  .  Transportation needs:    Medical: Not on file    Non-medical: Not on file  Tobacco Use  . Smoking status: Never Smoker  . Smokeless tobacco: Never Used  Substance and Sexual Activity  . Alcohol use: Yes    Comment: very rarely  . Drug use: No  . Sexual activity: Yes    Partners: Male    Birth control/protection: IUD  Lifestyle  . Physical activity:    Days per week: 4 days    Minutes per session: 30 min  . Stress: Only a little  Relationships  . Social connections:    Talks on phone: More than three times a week    Gets together: More than three times a week    Attends religious service: More than 4 times per year    Active member of club or organization: Patient refused    Attends meetings of clubs or organizations: Patient refused    Relationship status: Married  . Intimate partner violence:    Fear of current or ex partner: Patient refused    Emotionally abused: Patient refused    Physically abused: Patient refused    Forced sexual activity: Patient refused  Other Topics Concern  . Not on file  Social History Narrative  .  Not on file    Outpatient Medications Prior to Visit  Medication Sig Dispense Refill  . amLODipine (NORVASC) 10 MG tablet TAKE ONE TABLET BY MOUTH EVERY DAY 90 tablet 1  . Cholecalciferol (VITAMIN D) 2000 UNITS tablet Take 2,000 Units by mouth daily.    . metoprolol succinate (TOPROL-XL) 25 MG 24 hr tablet TAKE ONE TABLET BY MOUTH EVERY MORNING 90 tablet 1  . MULTIPLE VITAMIN PO Take 1 tablet by mouth daily.    . cetirizine (ZYRTEC ALLERGY) 10 MG tablet Take 1 tablet (10 mg total) by mouth daily. (Patient not taking: Reported on 09/27/2018) 30 tablet 2  . levonorgestrel (MIRENA) 20 MCG/24HR IUD once for 1 dose by Intrauterine route. 1 each 0  . albuterol (PROVENTIL HFA;VENTOLIN HFA) 108 (90 Base) MCG/ACT inhaler Inhale 2 puffs into the lungs every 6 (six) hours as needed for wheezing or shortness of breath. 1 Inhaler 0   No facility-administered  medications prior to visit.     Allergies  Allergen Reactions  . Azithromycin Other (See Comments)    Other reaction(s): Vomiting  . Codeine Other (See Comments)    unknown  . Penicillins Rash    Review of Systems  Constitutional: Negative.   Eyes: Positive for blurred vision.  Cardiovascular: Negative.   Musculoskeletal: Positive for neck pain.  Neurological: Positive for dizziness and headaches.       Objective:    Physical Exam  Constitutional: She is oriented to person, place, and time. She appears well-developed and well-nourished.  Eyes: Pupils are equal, round, and reactive to light. EOM are normal.  Cardiovascular: Normal rate and regular rhythm.  Pulmonary/Chest: Effort normal and breath sounds normal.  Neurological: She is alert and oriented to person, place, and time. No cranial nerve deficit. Coordination normal.  Strength 5/5 bilateral upper and lower extremities.    Skin: Skin is warm and dry.  Psychiatric: She has a normal mood and affect. Her behavior is normal.    BP (!) 148/96 (BP Location: Left Arm, Patient Position: Sitting, Cuff Size: Normal)   Pulse 72   Temp 98.2 F (36.8 C) (Oral)   Resp 16   Ht 5\' 8"  (1.727 m)   Wt 230 lb (104.3 kg)   SpO2 98%   BMI 34.97 kg/m  Wt Readings from Last 3 Encounters:  09/27/18 230 lb (104.3 kg)  03/09/18 239 lb (108.4 kg)  11/25/17 247 lb (112 kg)    Health Maintenance Due  Topic Date Due  . HIV Screening  05/18/1986    There are no preventive care reminders to display for this patient.   Lab Results  Component Value Date   TSH 2.210 07/21/2016   Lab Results  Component Value Date   WBC 8.7 07/21/2016   HGB 13.9 07/21/2016   HCT 40.8 07/21/2016   MCV 88 07/21/2016   PLT 341 07/21/2016   Lab Results  Component Value Date   NA 139 07/21/2016   K 4.4 07/21/2016   CO2 23 07/21/2016   GLUCOSE 92 07/21/2016   BUN 14 07/21/2016   CREATININE 0.76 07/21/2016   BILITOT 1.1 07/21/2016   ALKPHOS  55 07/21/2016   AST 21 07/21/2016   ALT 18 07/21/2016   PROT 7.6 07/21/2016   ALBUMIN 4.6 07/21/2016   CALCIUM 10.2 07/21/2016   ANIONGAP 12 03/30/2015   Lab Results  Component Value Date   CHOL 209 (H) 07/21/2016   Lab Results  Component Value Date   HDL 41 07/21/2016  Lab Results  Component Value Date   LDLCALC 126 (H) 07/21/2016   Lab Results  Component Value Date   TRIG 209 (H) 07/21/2016   Lab Results  Component Value Date   CHOLHDL 5.1 (H) 07/21/2016   Lab Results  Component Value Date   HGBA1C 5.8 (H) 07/21/2016       Assessment & Plan:  1. Essential hypertension  Start lisinopril as below in addition to amlodipine. Follow up with Jenni x 1 mo for BP recheck and labs.   - lisinopril (PRINIVIL,ZESTRIL) 10 MG tablet; Take 1 tablet (10 mg total) by mouth daily.  Dispense: 90 tablet; Refill: 0 - Comprehensive Metabolic Panel (CMET) - CBC with Differential - TSH - Lipid Profile  2. Encounter for screening for HIV  - HIV antibody (with reflex)  3. Morbid obesity ([place])  - HgB A1c   Meds ordered this encounter  Medications  . lisinopril (PRINIVIL,ZESTRIL) 10 MG tablet    Sig: Take 1 tablet (10 mg total) by mouth daily.    Dispense:  90 tablet    Refill:  0    Order Specific Question:   Supervising Provider    Answer:   Birdie Sons [[id number]]   The entirety of the information documented in the History of Present Illness, Review of Systems and Physical Exam were personally obtained by me. Portions of this information were initially documented by Lynford Humphrey, CMA and reviewed by me for thoroughness and accuracy.    Trinna Post, PA-C

## 2018-09-27 NOTE — Patient Instructions (Signed)

## 2018-09-28 LAB — COMPREHENSIVE METABOLIC PANEL
ALT: 18 IU/L (ref 0–32)
AST: 16 IU/L (ref 0–40)
Albumin/Globulin Ratio: 1.9 (ref 1.2–2.2)
Albumin: 5 g/dL (ref 3.5–5.5)
Alkaline Phosphatase: 59 IU/L (ref 39–117)
BUN/Creatinine Ratio: 14 (ref 9–23)
BUN: 12 mg/dL (ref 6–24)
Bilirubin Total: 1.7 mg/dL — ABNORMAL HIGH (ref 0.0–1.2)
CO2: 21 mmol/L (ref 20–29)
Calcium: 10.3 mg/dL — ABNORMAL HIGH (ref 8.7–10.2)
Chloride: 99 mmol/L (ref 96–106)
Creatinine, Ser: 0.85 mg/dL (ref 0.57–1.00)
GFR calc Af Amer: 94 mL/min/{1.73_m2} (ref >59)
GFR calc non Af Amer: 82 mL/min/{1.73_m2} (ref >59)
Globulin, Total: 2.6 g/dL (ref 1.5–4.5)
Glucose: 95 mg/dL (ref 65–99)
Potassium: 4.3 mmol/L (ref 3.5–5.2)
Sodium: 136 mmol/L (ref 134–144)
Total Protein: 7.6 g/dL (ref 6.0–8.5)

## 2018-09-28 LAB — CBC WITH DIFFERENTIAL/PLATELET
Basophils Absolute: 0.1 10*3/uL (ref 0.0–0.2)
Basos: 1 %
EOS (ABSOLUTE): 0.1 10*3/uL (ref 0.0–0.4)
Eos: 2 %
Hematocrit: 43.5 % (ref 34.0–46.6)
Hemoglobin: 15 g/dL (ref 11.1–15.9)
Immature Grans (Abs): 0 10*3/uL (ref 0.0–0.1)
Immature Granulocytes: 0 %
Lymphocytes Absolute: 3.4 10*3/uL — ABNORMAL HIGH (ref 0.7–3.1)
Lymphs: 43 %
MCH: 30.5 pg (ref 26.6–33.0)
MCHC: 34.5 g/dL (ref 31.5–35.7)
MCV: 88 fL (ref 79–97)
Monocytes Absolute: 0.8 10*3/uL (ref 0.1–0.9)
Monocytes: 10 %
Neutrophils Absolute: 3.6 10*3/uL (ref 1.4–7.0)
Neutrophils: 44 %
Platelets: 393 10*3/uL (ref 150–450)
RBC: 4.92 x10E6/uL (ref 3.77–5.28)
RDW: 12.5 % (ref 12.3–15.4)
WBC: 8 10*3/uL (ref 3.4–10.8)

## 2018-09-28 LAB — HEMOGLOBIN A1C
Est. average glucose Bld gHb Est-mCnc: 114 mg/dL
Hgb A1c MFr Bld: 5.6 % (ref 4.8–5.6)

## 2018-09-28 LAB — LIPID PANEL
Chol/HDL Ratio: 5.1 ratio — ABNORMAL HIGH (ref 0.0–4.4)
Cholesterol, Total: 221 mg/dL — ABNORMAL HIGH (ref 100–199)
HDL: 43 mg/dL (ref >39)
LDL Calculated: 140 mg/dL — ABNORMAL HIGH (ref 0–99)
Triglycerides: 192 mg/dL — ABNORMAL HIGH (ref 0–149)
VLDL Cholesterol Cal: 38 mg/dL (ref 5–40)

## 2018-09-28 LAB — HIV ANTIBODY (ROUTINE TESTING W REFLEX): HIV Screen 4th Generation wRfx: NONREACTIVE

## 2018-09-28 LAB — TSH: TSH: 2.16 u[IU]/mL (ref 0.450–4.500)

## 2018-10-03 ENCOUNTER — Other Ambulatory Visit: Payer: Self-pay | Admitting: Family Medicine

## 2018-10-26 ENCOUNTER — Ambulatory Visit: Payer: BLUE CROSS/BLUE SHIELD | Admitting: Physician Assistant

## 2018-10-26 ENCOUNTER — Encounter: Payer: Self-pay | Admitting: Physician Assistant

## 2018-10-26 VITALS — BP 137/82 | HR 93 | Temp 98.3°F | Wt 231.6 lb

## 2018-10-26 DIAGNOSIS — I10 Essential (primary) hypertension: Secondary | ICD-10-CM | POA: Diagnosis not present

## 2018-10-26 MED ORDER — LISINOPRIL 20 MG PO TABS: 20.0000 mg | ORAL_TABLET | Freq: Every day | ORAL | 0 refills | Status: DC

## 2018-10-26 NOTE — Progress Notes (Signed)
Patient: Amanda Ibarra Female    DOB: 10/16/1971   47 y.o.   MRN: [id number] Visit Date: 10/26/2018  Today's Provider: Trinna Post, PA-C   Chief Complaint  Patient presents with  . Hypertension   Subjective:     HPI  Hypertension, follow-up:  BP Readings from Last 3 Encounters:  10/26/18 137/82  09/27/18 (!) 148/96  03/09/18 (!) 146/68    She was last seen for hypertension 1 months ago.  BP at that visit was 148/96. Management changes since that visit include start 10 mg Lisinopril in addition to 10 mg Amlodipine. She also started using her amlodipine at night.  She reports good compliance with treatment. She is not having side effects.  She is exercising. She is adherent to low salt diet.   Outside blood pressures are being checked at home. She is experiencing none.  Patient denies chest pain, chest pressure/discomfort, claudication, dyspnea, exertional chest pressure/discomfort, fatigue, irregular heart beat, lower extremity edema, near-syncope, orthopnea, palpitations, paroxysmal nocturnal dyspnea, syncope and tachypnea.   Cardiovascular risk factors include hypertension.  Use of agents associated with hypertension: none.     Weight trend: stable Wt Readings from Last 3 Encounters:  10/26/18 231 lb 9.6 oz (105.1 kg)  09/27/18 230 lb (104.3 kg)  03/09/18 239 lb (108.4 kg)    Current diet: in general, a "healthy" diet    ------------------------------------------------------------------------  Allergies  Allergen Reactions  . Azithromycin Other (See Comments)    Other reaction(s): Vomiting  . Codeine Other (See Comments)    unknown  . Penicillins Rash     Current Outpatient Medications:  .  amLODipine (NORVASC) 10 MG tablet, TAKE ONE TABLET BY MOUTH EVERY DAY, Disp: 90 tablet, Rfl: 1 .  cetirizine (ZYRTEC ALLERGY) 10 MG tablet, Take 1 tablet (10 mg total) by mouth daily., Disp: 30 tablet, Rfl: 2 .  Cholecalciferol (VITAMIN D) 2000 UNITS  tablet, Take 2,000 Units by mouth daily., Disp: , Rfl:  .  fluticasone (FLONASE) 50 MCG/ACT nasal spray, SPRAY TWICE INTO EACH NOSTRIL ONCE DAILY, Disp: 16 g, Rfl: 3 .  metoprolol succinate (TOPROL-XL) 25 MG 24 hr tablet, TAKE ONE TABLET BY MOUTH EVERY MORNING, Disp: 90 tablet, Rfl: 1 .  MULTIPLE VITAMIN PO, Take 1 tablet by mouth daily., Disp: , Rfl:  .  levonorgestrel (MIRENA) 20 MCG/24HR IUD, once for 1 dose by Intrauterine route., Disp: 1 each, Rfl: 0 .  lisinopril (PRINIVIL,ZESTRIL) 20 MG tablet, Take 1 tablet (20 mg total) by mouth daily., Disp: 90 tablet, Rfl: 0  Review of Systems  Constitutional: Negative.   Respiratory: Negative.   Cardiovascular: Negative.   Musculoskeletal: Negative.     Social History   Tobacco Use  . Smoking status: Never Smoker  . Smokeless tobacco: Never Used  Substance Use Topics  . Alcohol use: Yes    Comment: very rarely      Objective:   BP 137/82 (BP Location: Left Arm, Patient Position: Sitting, Cuff Size: Large)   Pulse 93   Temp 98.3 F (36.8 C) (Oral)   Wt 231 lb 9.6 oz (105.1 kg)   SpO2 99%   BMI 35.21 kg/m  Vitals:   10/26/18 0815  BP: 137/82  Pulse: 93  Temp: 98.3 F (36.8 C)  TempSrc: Oral  SpO2: 99%  Weight: 231 lb 9.6 oz (105.1 kg)   Wt Readings from Last 3 Encounters:  10/26/18 231 lb 9.6 oz (105.1 kg)  09/27/18 230 lb (104.3 kg)  03/09/18 239 lb (108.4 kg)     Physical Exam Constitutional:      Appearance: Normal appearance.  Cardiovascular:     Rate and Rhythm: Normal rate and regular rhythm.     Heart sounds: No murmur.  Pulmonary:     Effort: Pulmonary effort is normal.     Breath sounds: Normal breath sounds.  Skin:    General: Skin is warm and dry.  Neurological:     Mental Status: She is alert and oriented to person, place, and time. Mental status is at baseline.  Psychiatric:        Mood and Affect: Mood normal.        Behavior: Behavior normal.         Assessment & Plan    1. Essential  hypertension  Blood pressure is under better control but still borderline high. Will increase Lisinopril to 20 mg daily in addition to her amlodipine 10 mg daily. Check labs as below. Follow up in 3-6 months with Tawanna Sat.   - lisinopril (PRINIVIL,ZESTRIL) 20 MG tablet; Take 1 tablet (20 mg total) by mouth daily.  Dispense: 90 tablet; Refill: 0 - Comprehensive Metabolic Panel (CMET)  No follow-ups on file.  The entirety of the information documented in the History of Present Illness, Review of Systems and Physical Exam were personally obtained by me. Portions of this information were initially documented by Idelle Jo, CMA and reviewed by me for thoroughness and accuracy.         Trinna Post, PA-C  [facility]

## 2018-10-26 NOTE — Patient Instructions (Signed)

## 2018-10-27 LAB — COMPREHENSIVE METABOLIC PANEL
ALT: 18 IU/L (ref 0–32)
AST: 19 IU/L (ref 0–40)
Albumin/Globulin Ratio: 1.7 (ref 1.2–2.2)
Albumin: 4.8 g/dL (ref 3.5–5.5)
Alkaline Phosphatase: 61 IU/L (ref 39–117)
BUN/Creatinine Ratio: 14 (ref 9–23)
BUN: 12 mg/dL (ref 6–24)
Bilirubin Total: 1.3 mg/dL — ABNORMAL HIGH (ref 0.0–1.2)
CO2: 20 mmol/L (ref 20–29)
Calcium: 10.4 mg/dL — ABNORMAL HIGH (ref 8.7–10.2)
Chloride: 101 mmol/L (ref 96–106)
Creatinine, Ser: 0.88 mg/dL (ref 0.57–1.00)
GFR calc Af Amer: 90 mL/min/{1.73_m2} (ref >59)
GFR calc non Af Amer: 78 mL/min/{1.73_m2} (ref >59)
Globulin, Total: 2.8 g/dL (ref 1.5–4.5)
Glucose: 92 mg/dL (ref 65–99)
Potassium: 4.4 mmol/L (ref 3.5–5.2)
Sodium: 139 mmol/L (ref 134–144)
Total Protein: 7.6 g/dL (ref 6.0–8.5)

## 2018-10-29 ENCOUNTER — Telehealth: Payer: Self-pay

## 2018-10-29 NOTE — Telephone Encounter (Signed)
Patient was advised.  

## 2018-10-29 NOTE — Telephone Encounter (Signed)
-----   Message from Trinna Post, [place] sent at 10/29/2018  9:02 AM EST ----- Lab work looks good, continue blood pressure medications and follow up in clinic.

## 2018-11-17 ENCOUNTER — Other Ambulatory Visit: Payer: Self-pay | Admitting: Physician Assistant

## 2018-11-17 DIAGNOSIS — I10 Essential (primary) hypertension: Secondary | ICD-10-CM

## 2018-11-21 DIAGNOSIS — L814 Other melanin hyperpigmentation: Secondary | ICD-10-CM | POA: Diagnosis not present

## 2018-11-21 DIAGNOSIS — L821 Other seborrheic keratosis: Secondary | ICD-10-CM | POA: Diagnosis not present

## 2018-11-21 DIAGNOSIS — Z1283 Encounter for screening for malignant neoplasm of skin: Secondary | ICD-10-CM | POA: Diagnosis not present

## 2018-11-21 DIAGNOSIS — D229 Melanocytic nevi, unspecified: Secondary | ICD-10-CM | POA: Diagnosis not present

## 2018-12-14 ENCOUNTER — Other Ambulatory Visit: Payer: Self-pay | Admitting: Physician Assistant

## 2018-12-14 DIAGNOSIS — I10 Essential (primary) hypertension: Secondary | ICD-10-CM

## 2019-01-25 ENCOUNTER — Ambulatory Visit: Payer: Self-pay | Admitting: Physician Assistant

## 2019-02-18 ENCOUNTER — Telehealth: Payer: Self-pay

## 2019-02-18 ENCOUNTER — Other Ambulatory Visit: Payer: Self-pay | Admitting: Physician Assistant

## 2019-02-18 DIAGNOSIS — I10 Essential (primary) hypertension: Secondary | ICD-10-CM

## 2019-02-18 NOTE — Telephone Encounter (Signed)
Pt thinks her IUD may be out of place. Period is heavier than normal.  What to do? [phone]  Pt started bleeding heavily Saturday after mowing rough lawn on riding lawn mower.  Was uncomfortable the week before after mowing as well.  Right now she is not saturating a pad q5min to 1hr.  Adv to be seen.  MRN sent to [facility] to schedule.

## 2019-02-21 ENCOUNTER — Other Ambulatory Visit: Payer: Self-pay

## 2019-02-21 ENCOUNTER — Encounter: Payer: Self-pay | Admitting: Certified Nurse Midwife

## 2019-02-21 ENCOUNTER — Ambulatory Visit: Payer: BLUE CROSS/BLUE SHIELD | Admitting: Certified Nurse Midwife

## 2019-02-21 VITALS — BP 142/66 | HR 116 | Ht 68.0 in | Wt 238.0 lb

## 2019-02-21 DIAGNOSIS — N939 Abnormal uterine and vaginal bleeding, unspecified: Secondary | ICD-10-CM | POA: Diagnosis not present

## 2019-02-21 DIAGNOSIS — N951 Menopausal and female climacteric states: Secondary | ICD-10-CM | POA: Diagnosis not present

## 2019-02-21 DIAGNOSIS — Z975 Presence of (intrauterine) contraceptive device: Secondary | ICD-10-CM

## 2019-02-21 NOTE — Progress Notes (Signed)
Obstetrics & Gynecology Office Visit   Chief Complaint:  Chief Complaint  Patient presents with  . IUD check    Heavy bleeding x2 days, cramping    History of Present Illness: Amanda Ibarra is a 48 year old Caucasian/White female, G1 P1001, who presents with concerns regarding whether her IUD is in place. Her current Mirena IUD was placed in November 2018. She had an ultrasound  December 2018 when her IUD strings were not visible, which showed the IUD was in place. Her menses on the IUD have been lite/ spotting x 1-2 days most months. About every third month she would have one day of a more moderate flow. With her last menses she started with spotting on 10 April, then her bleeding increased on 11 April to where she was changing pads every 2 hours x 6 hours and had grape sized clots. Her bleeding has tapered off since then. Her bleeding was accompanied by some cramping, but that was not unusual. She did also report some dull RLQ cramping about 2 weeks prior to that that lasted 1-2 days.  Reports some hot flashes occasionally Review of Systems:  ROS -see HPI  Past Medical History:  Past Medical History:  Diagnosis Date  . CIN I (cervical intraepithelial neoplasia I)   . Hypertension   . Menorrhagia   . Vitamin D deficiency     Past Surgical History:  Past Surgical History:  Procedure Laterality Date  . CERVICAL BIOPSY  W/ LOOP ELECTRODE EXCISION  12/12/1995  . COLPOSCOPY  10/09/1996  . DILATION AND CURETTAGE OF UTERUS  11/2011   for menorrhagia  . IUD MIrena    . TONSILLECTOMY    . Wisdon teeth      Gynecologic History: No LMP recorded. (Menstrual status: IUD).  Obstetric History: G1P1001  Family History:  Family History  Problem Relation Age of Onset  . Hypertension Father   . Arthritis/Rheumatoid Mother   . Hyperlipidemia Mother   . Hypertension Mother   . Thyroid disease Mother   . Breast cancer Mother 57  . Bone cancer Maternal Grandmother   . Heart  attack Maternal Grandfather   . Gout Maternal Grandfather     Social History:  Social History   Socioeconomic History  . Marital status: Married    Spouse name: Not on file  . Number of children: 1  . Years of education: Bachelors  . Highest education level: Not on file  Occupational History    Employer: [employer]    Comment: Full Time  Social Needs  . Financial resource strain: Not on file  . Food insecurity:    Worry: Not on file    Inability: Not on file  . Transportation needs:    Medical: Not on file    Non-medical: Not on file  Tobacco Use  . Smoking status: Never Smoker  . Smokeless tobacco: Never Used  Substance and Sexual Activity  . Alcohol use: Yes    Comment: very rarely  . Drug use: No  . Sexual activity: Yes    Partners: Male    Birth control/protection: I.U.D.  Lifestyle  . Physical activity:    Days per week: 4 days    Minutes per session: 30 min  . Stress: Only a little  Relationships  . Social connections:    Talks on phone: More than three times a week    Gets together: More than three times a week    Attends religious service: More  than 4 times per year    Active member of club or organization: Patient refused    Attends meetings of clubs or organizations: Patient refused    Relationship status: Married  . Intimate partner violence:    Fear of current or ex partner: Patient refused    Emotionally abused: Patient refused    Physically abused: Patient refused    Forced sexual activity: Patient refused  Other Topics Concern  . Not on file  Social History Narrative  . Not on file    Allergies:  Allergies  Allergen Reactions  . Azithromycin Other (See Comments)    Other reaction(s): Vomiting  . Codeine Other (See Comments)    unknown  . Penicillins Rash    Medications: Prior to Admission medications   Medication Sig Start Date End Date Taking? Authorizing Provider  amLODipine (NORVASC) 10 MG tablet TAKE 1 TABLET BY MOUTH  DAILY 12/14/18  Yes Mar Daring, PA-C  Cholecalciferol (VITAMIN D) 2000 UNITS tablet Take 2,000 Units by mouth daily.   Yes [provider]  fluticasone (FLONASE) 50 MCG/ACT nasal spray SPRAY TWICE INTO EACH NOSTRIL ONCE DAILY 10/05/18  Yes Jerrol Banana., MD  lisinopril (PRINIVIL,ZESTRIL) 20 MG tablet TAKE 1 TABLET BY MOUTH DAILY 02/18/19  Yes Carles Collet M, PA-C  metoprolol succinate (TOPROL-XL) 25 MG 24 hr tablet TAKE 1 TABLET BY MOUTH EVERY MORNING 12/14/18  Yes Fenton Malling M, PA-C  MULTIPLE VITAMIN PO Take 1 tablet by mouth daily.   Yes [provider]  cetirizine (ZYRTEC ALLERGY) 10 MG tablet Take 1 tablet (10 mg total) by mouth daily. Patient not taking: Reported on 02/21/2019 03/09/18   Dalia Heading, CNM  levonorgestrel (MIRENA) 20 MCG/24HR IUD once for 1 dose by Intrauterine route. 09/15/17 09/15/17  Dalia Heading, CNM    Physical Exam Vitals:BP (!) 142/66 (BP Location: Left Arm, Patient Position: Sitting, Cuff Size: Large)   Pulse (!) 116   Ht 5\' 8"  (1.727 m)   Wt 238 lb (108 kg)   BMI 36.19 kg/m  No LMP recorded. (Menstrual status: IUD).  Physical Exam  Constitutional: She is oriented to person, place, and time. She appears well-developed and well-nourished. No distress.  Respiratory: Effort normal.  Genitourinary:    Genitourinary Comments: Vulva: no lesions or inflammation Vagina: white to clear discharge, no blood Cervix: nullip, no IUD strings visible, no lesions Uterus: RF, NSSC, NT, decreased mobility Adnexa: no masses, NT   Neurological: She is alert and oriented to person, place, and time.  Skin: Skin is warm and dry.  Psychiatric: She has a normal mood and affect.  Informal transabdominal ultrasound done. IUD was seen in endometrial canal and strings were in the cervical cancal. Incidental finding: 2 small intramural fibroids in the anterior fundal area   Assessment: 48 y.o. G1P1001 IUD seems to be in place ?  Perimenopausal symptoms  Plan:Reassured that IUD in place. FU for annual as scheduled and prn Briefly discussed treatment for vasomotor symptoms (OTC and RX)  Dalia Heading, CNM

## 2019-03-18 ENCOUNTER — Other Ambulatory Visit: Payer: Self-pay | Admitting: Physician Assistant

## 2019-03-18 DIAGNOSIS — Z1231 Encounter for screening mammogram for malignant neoplasm of breast: Secondary | ICD-10-CM

## 2019-03-19 ENCOUNTER — Ambulatory Visit: Payer: BLUE CROSS/BLUE SHIELD | Admitting: Certified Nurse Midwife

## 2019-03-28 DIAGNOSIS — D2271 Melanocytic nevi of right lower limb, including hip: Secondary | ICD-10-CM | POA: Diagnosis not present

## 2019-03-28 DIAGNOSIS — D225 Melanocytic nevi of trunk: Secondary | ICD-10-CM | POA: Diagnosis not present

## 2019-04-17 ENCOUNTER — Other Ambulatory Visit: Payer: Self-pay

## 2019-04-17 ENCOUNTER — Ambulatory Visit
Admission: RE | Admit: 2019-04-17 | Discharge: 2019-04-17 | Disposition: A | Discharge status: Home / Self Care | Payer: BC Managed Care – PPO | Source: Ambulatory Visit | Attending: Physician Assistant | Admitting: Physician Assistant

## 2019-04-17 DIAGNOSIS — Z1231 Encounter for screening mammogram for malignant neoplasm of breast: Secondary | ICD-10-CM | POA: Diagnosis not present

## 2019-04-30 ENCOUNTER — Encounter: Payer: Self-pay | Admitting: Certified Nurse Midwife

## 2019-04-30 ENCOUNTER — Ambulatory Visit (INDEPENDENT_AMBULATORY_CARE_PROVIDER_SITE_OTHER): Payer: BC Managed Care – PPO | Admitting: Certified Nurse Midwife

## 2019-04-30 ENCOUNTER — Other Ambulatory Visit: Payer: Self-pay

## 2019-04-30 VITALS — BP 130/70 | HR 96 | Ht 68.0 in | Wt 242.6 lb

## 2019-04-30 DIAGNOSIS — Z124 Encounter for screening for malignant neoplasm of cervix: Secondary | ICD-10-CM

## 2019-04-30 DIAGNOSIS — Z79899 Other long term (current) drug therapy: Secondary | ICD-10-CM | POA: Diagnosis not present

## 2019-04-30 DIAGNOSIS — N938 Other specified abnormal uterine and vaginal bleeding: Secondary | ICD-10-CM | POA: Diagnosis not present

## 2019-04-30 DIAGNOSIS — Z01419 Encounter for gynecological examination (general) (routine) without abnormal findings: Secondary | ICD-10-CM

## 2019-04-30 DIAGNOSIS — Z1211 Encounter for screening for malignant neoplasm of colon: Secondary | ICD-10-CM

## 2019-04-30 DIAGNOSIS — Z0000 Encounter for general adult medical examination without abnormal findings: Secondary | ICD-10-CM | POA: Diagnosis not present

## 2019-04-30 DIAGNOSIS — I10 Essential (primary) hypertension: Secondary | ICD-10-CM | POA: Diagnosis not present

## 2019-04-30 NOTE — Progress Notes (Signed)
Gynecology Annual Exam  PCP: Mar Daring, PA-C  Chief Complaint:  Chief Complaint  Patient presents with  . Gynecologic Exam    History of Present Illness: Amanda Ibarra is a 48 year old Caucasian/White female, G1 P1001, who presents for her annual exam. She has no significant gyn concerns at this time. Was seen in April for a heavier menses and she had an ultrasound which showed her IUD was in place (strings not visible), but since then she had monthly menses in May and June. Both were light and lasted 4 days.  Her menses are usually regular. They occur every month and last 4-5 days and are spotting to a light flow.   She reports mild dysmenorrhea not usually requiring analgesics most of the time. Takes ibuprofen for cramping maybe twice a year. The patient's past medical history is notable for a history of hypertension, kidney stones,  and menorrhagia. In 2013 she had a D&C and hysteroscopy for menorrhagia and path revealed polypoid tissue. She had a Mirena inserted 07/18/2012 for tx of menorrhagia and it was replaced in November 2018. THe following month a ultrasound was done when the IUD strings were not visible and the IUD was noted to be in the correct location. 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 Since her last annual GYN exam dated 03/09/2018, she has had no other changes in her health. Her blood pressure has been better controlled since she is taking Norvasc, metoprolol  and lisinopril She is sexually active. She is currently using an IUD for contraception.  Her most recent pap smear was obtained 03/09/2018 and was NIL. Her most recent mammogram obtained on 04/17/2019 was normal and revealed no significant changes.  There is a family history of breast cancer in her mother and genetic testing has not been done. There is no family history of ovarian cancer.  The patient does do monthly self breast exams.  The patient does not  smoke.  The patient does drink infrequently.  The patient does not use illegal drugs.  The patient has been working from home during the Covid pandemic and has not been exercising as regularly. Currentt BMI is 36.9 kg/m2 The patient does get adequate calcium in her diet and she is taking vitamin D3 supplements  She has  had a recent cholesterol screen at PCP 09/27/2018 and it was borderline.    Review of Systems: Review of Systems  Constitutional: Negative for chills, fever and weight loss.  HENT: Negative for congestion, sinus pain and sore throat.   Eyes: Negative for blurred vision and pain.  Respiratory: Negative for cough, hemoptysis, sputum production, shortness of breath and wheezing.   Cardiovascular: Negative for chest pain, palpitations and leg swelling.  Gastrointestinal: Negative for abdominal pain, blood in stool, diarrhea, heartburn, nausea and vomiting.  Genitourinary: Negative for dysuria, frequency, hematuria and urgency.  Musculoskeletal: Negative for back pain, joint pain and myalgias.  Skin: Negative for itching and rash.  Neurological: Negative for dizziness, tingling and headaches.  Endo/Heme/Allergies: Negative for environmental allergies and polydipsia. Does not bruise/bleed easily.       Negative for hirsutism   Psychiatric/Behavioral: Negative for depression. The patient is not nervous/anxious and does not have insomnia.     Past Medical History:  Past Medical History:  Diagnosis Date  . CIN I (cervical intraepithelial neoplasia I)   . Hypertension   . Menorrhagia   . Vitamin D deficiency  Past Surgical History:  Past Surgical History:  Procedure Laterality Date  . CERVICAL BIOPSY  W/ LOOP ELECTRODE EXCISION  12/12/1995  . COLPOSCOPY  10/09/1996  . DILATION AND CURETTAGE OF UTERUS  11/2011   for menorrhagia  . IUD MIrena    . TONSILLECTOMY    . Wisdon teeth      Family History:  Family History  Problem Relation Age of Onset  .  Hypertension Father   . Arthritis/Rheumatoid Mother   . Hyperlipidemia Mother   . Hypertension Mother   . Thyroid disease Mother   . Breast cancer Mother 32  . Bone cancer Maternal Grandmother   . Heart attack Maternal Grandfather   . Gout Maternal Grandfather     Social History:  Social History   Socioeconomic History  . Marital status: Married    Spouse name: Not on file  . Number of children: 1  . Years of education: Bachelors  . Highest education level: Not on file  Occupational History    Employer: [employer]    Comment: Full Time  Social Needs  . Financial resource strain: Not on file  . Food insecurity    Worry: Not on file    Inability: Not on file  . Transportation needs    Medical: Not on file    Non-medical: Not on file  Tobacco Use  . Smoking status: Never Smoker  . Smokeless tobacco: Never Used  Substance and Sexual Activity  . Alcohol use: Yes    Comment: very rarely  . Drug use: No  . Sexual activity: Yes    Partners: Male    Birth control/protection: I.U.D.  Lifestyle  . Physical activity    Days per week: 4 days    Minutes per session: 30 min  . Stress: Only a little  Relationships  . Social connections    Talks on phone: More than three times a week    Gets together: More than three times a week    Attends religious service: More than 4 times per year    Active member of club or organization: Patient refused    Attends meetings of clubs or organizations: Patient refused    Relationship status: Married  . Intimate partner violence    Fear of current or ex partner: Patient refused    Emotionally abused: Patient refused    Physically abused: Patient refused    Forced sexual activity: Patient refused  Other Topics Concern  . Not on file  Social History Narrative  . Not on file    Allergies:  Allergies  Allergen Reactions  . Azithromycin Other (See Comments)    Other reaction(s): Vomiting  . Codeine Other (See Comments)     unknown  . Penicillins Rash    Medications:  Current Outpatient Medications on File Prior to Visit  Medication Sig Dispense Refill  . amLODipine (NORVASC) 10 MG tablet TAKE 1 TABLET BY MOUTH DAILY 90 tablet 1  . Black Pepper-Turmeric (TURMERIC CURCUMIN) 03-999 MG CAPS Take 5-1,000 mg by mouth daily.    . Cholecalciferol (VITAMIN D) 2000 UNITS tablet Take 2,000 Units by mouth daily.    . fluticasone (FLONASE) 50 MCG/ACT nasal spray SPRAY TWICE INTO EACH NOSTRIL ONCE DAILY 16 g 3  . lisinopril (PRINIVIL,ZESTRIL) 20 MG tablet TAKE 1 TABLET BY MOUTH DAILY 90 tablet 0  . metoprolol succinate (TOPROL-XL) 25 MG 24 hr tablet TAKE 1 TABLET BY MOUTH EVERY MORNING 90 tablet 1  . MULTIPLE VITAMIN PO Take 1  tablet by mouth daily.    Marland Kitchen levonorgestrel (MIRENA) 20 MCG/24HR IUD once for 1 dose by Intrauterine route. 1 each 0   No current facility-administered medications on file prior to visit.      Physical Exam Vitals: BP 130/70   Pulse 96   Ht 5\' 8"  (1.727 m)   Wt 242 lb 9.6 oz (110 kg)   LMP 04/09/2019 (Exact Date)   BMI 36.89 kg/m   General: WF in NAD HEENT: normocephalic, anicteric Thyroid: no enlargement, no palpable nodules Pulmonary: No increased work of breathing, CTAB  Cardiovascular: RRR without murmur Breast: Breast symmetrical, no tenderness, no palpable nodules or masses, no skin or nipple retraction present, no nipple discharge.  No axillary, infraclavicular, or supraclavicular lymphadenopathy. Abdomen: soft, non-tender, non-distended, obese.  Umbilicus without lesions.  No hepatomegaly or masses palpable. No evidence of hernia  Genitourinary:  External: Normal external female genitalia.  Normal urethral meatus, normal Bartholin's and Skene's glands.    Vagina: Normal vaginal mucosa, no evidence of prolapse.    Cervix: Grossly normal in appearance, no bleeding, IUD strings not visible, no CMT  Uterus: Retroflexed, decreased mobility, NT, NSSC  Adnexa:  no adnexal masses, NT   Rectal: deferred  Lymphatic: no evidence of inguinal lymphadenopathy Extremities: no edema, erythema, or tenderness Neurologic: Grossly intact Psychiatric: mood appropriate, affect full      Assessment: 48 y.o. G1P1001 well woman exam IUD strings not visible, but with a recent ultrasound showing the IUD in place.  Plan:   1) Mammogram - recommend continued yearly screening mammogram. Mammogram UTD  2) Pap done  3) Contraception- Mirena   4) Routine healthcare maintenance including cholesterol, diabetes screening  managed by PCP   5) Colon cancer screening: Discussed recent recommendations to start colon cancer screening at age 37 and options for colon cancer screening including FIT testing, Cologuard, colonoscopy. Patient undecided and advised to let me know if she wishes to start screening  6) RTO in one year for her annual exam and prn.  Dalia Heading, CNM

## 2019-05-04 LAB — IGP,RFX APTIMA HPV ALL PTH

## 2019-11-29 ENCOUNTER — Ambulatory Visit (INDEPENDENT_AMBULATORY_CARE_PROVIDER_SITE_OTHER): Payer: Self-pay | Admitting: Certified Nurse Midwife

## 2019-11-29 ENCOUNTER — Encounter: Payer: Self-pay | Admitting: Certified Nurse Midwife

## 2019-11-29 ENCOUNTER — Ambulatory Visit (INDEPENDENT_AMBULATORY_CARE_PROVIDER_SITE_OTHER): Payer: BC Managed Care – PPO

## 2019-11-29 ENCOUNTER — Other Ambulatory Visit: Payer: Self-pay

## 2019-11-29 ENCOUNTER — Other Ambulatory Visit: Payer: Self-pay | Admitting: Certified Nurse Midwife

## 2019-11-29 ENCOUNTER — Telehealth: Payer: Self-pay

## 2019-11-29 VITALS — BP 158/80 | HR 115 | Ht 68.0 in | Wt 237.0 lb

## 2019-11-29 DIAGNOSIS — N83201 Unspecified ovarian cyst, right side: Secondary | ICD-10-CM

## 2019-11-29 DIAGNOSIS — T8332XD Displacement of intrauterine contraceptive device, subsequent encounter: Secondary | ICD-10-CM | POA: Diagnosis not present

## 2019-11-29 DIAGNOSIS — D252 Subserosal leiomyoma of uterus: Secondary | ICD-10-CM | POA: Diagnosis not present

## 2019-11-29 DIAGNOSIS — R102 Pelvic and perineal pain: Secondary | ICD-10-CM

## 2019-11-29 DIAGNOSIS — D251 Intramural leiomyoma of uterus: Secondary | ICD-10-CM

## 2019-11-29 DIAGNOSIS — N939 Abnormal uterine and vaginal bleeding, unspecified: Secondary | ICD-10-CM

## 2019-11-29 NOTE — Telephone Encounter (Signed)
Pt calling triage and states she has been having spotting for a week after her period, with slight pelvic pain, she had did some exercise and not sure if the pain is related to that, she just needs someone to reassure her or advise her on what to do. Should she just wait this out a little longer. Please advise if pt needs to come in for ultrasound to check iud location

## 2019-11-29 NOTE — Telephone Encounter (Signed)
PAtient called and given appointment for 1630 ultrasound today and follow up appointment with me. Jaclyn Shaggy

## 2019-11-30 DIAGNOSIS — N83201 Unspecified ovarian cyst, right side: Secondary | ICD-10-CM

## 2019-11-30 HISTORY — DX: Unspecified ovarian cyst, right side: N83.201

## 2019-11-30 NOTE — Progress Notes (Signed)
[id number] Amanda Ibarra is a 49 year old Caucasian/White female, G1 P1001, who presents with complaints of pelvic pain and prolonged spotting. She had a menses about 2 weeks ago that lasted 1 week followed by a week of spotting. She reports some left lower quadrant pain with the spotting.   Her menses are usually regular. They occur every month and last 4-5 days and are spotting to a light flow.  She reports mild dysmenorrhea not usually requiring analgesics most of the time. Takes ibuprofen for cramping maybe twice a year.  Her past medical history is remarkable for menorrhagia. In 2013 she had a D&C and hysteroscopy for menorrhagia and path revealed polypoid tissue. She had a Mirena inserted 07/18/2012 for tx of menorrhagia and it was replaced in November 2018. THe following month a ultrasound was done when the IUD strings were not visible and the IUD was noted to be in the correct location. She had another ultrasound for IUD location in 02/2019 when she had a heavier menses. Most recent annual exam was in June 2020.  Ultrasound demonstrates: Impression: 1. There are at least 5 uterine fibroids.   Fibroid 1:19.9 x 17.0 x 17.1 mm subserosal right  Fibroid 2:21.0 x 16.9 x 21.4 mm subserosal posterior left  Fibroid 3: 12.3 x 8.0 x 11.0 mm intramural left  Fibroid 4: 22.5 x 11.6 x 13.8 mm subserosal left  Fibroid 5: 13.8 x 8.7 x 10.9 mm intramural anterior 2. The IUD is correctly placed within the endometrial canal.  3. There is a 5.4 cm cyst with anechoic fluid and some free floating debris in the right ovary. 4. There is a dominant follicle in the left ovary.       PMHx: She  has a past medical history of CIN I (cervical intraepithelial neoplasia I), Hypertension, Menorrhagia, and Vitamin D deficiency. Also,  has a past surgical history that includes Wisdon teeth; Colposcopy (10/09/1996); Dilation and curettage of uterus (11/2011); Cervical biopsy w/ loop electrode excision (12/12/1995);  Tonsillectomy; and IUD MIrena., family history includes Arthritis/Rheumatoid in her mother; Bone cancer in her maternal grandmother; Breast cancer (age of onset: 74) in her mother; Gout in her maternal grandfather; Heart attack in her maternal grandfather; Hyperlipidemia in her mother; Hypertension in her father and mother; Thyroid disease in her mother.,  reports that she has never smoked. She has never used smokeless tobacco. She reports current alcohol use. She reports that she does not use drugs.  She has a current medication list which includes the following prescription(s): amlodipine, turmeric curcumin, vitamin d, fluticasone, lisinopril-hydrochlorothiazide, multiple vitamin, and levonorgestrel. Also, is allergic to azithromycin; codeine; and penicillins.  ROS  Objective: BP (!) 158/80   Pulse (!) 115   Ht 5\' 8"  (1.727 m)   Wt 237 lb (107.5 kg)   LMP 11/17/2019   BMI 36.04 kg/m   Physical examination Constitutional NAD, Conversant     Extremities: Moves all appropriately.  Normal ROM for age. No lymphadenopathy.  Neuro: Grossly intact  Psych: Oriented to PPT.  Normal mood. Normal affect.   Assessment: Pelvic pain and prolonged menses    Pelvic pain may be secondary to the 5.4 cm ovarian cyst   Fibroids: subserosal and intramural: doubt these are the cause of pain/ bleeding   IUD is in place P: Discussed results with patient after consulting Dr Georgianne Fick. Torsion precautions Repeat gyn pelvic ultrasound in 6 weeks and follow up at that time or sooner prn worsening symptoms.  Dalia Heading, CNM

## 2020-01-07 ENCOUNTER — Other Ambulatory Visit: Payer: Self-pay | Admitting: Physician Assistant

## 2020-01-07 NOTE — Telephone Encounter (Signed)
Requested medication (s) are due for refill today:   Not sure  Requested medication (s) are on the active medication list:   Yes  Future visit scheduled:   No   Last ordered: This was prescribed by a historical provider.   Requested Prescriptions  Pending Prescriptions Disp Refills   amLODipine (NORVASC) 10 MG tablet [Pharmacy Med Name: AMLODIPINE BESYLATE 10 MG TAB] 90 tablet     Sig: TAKE ONE TABLET EVERY DAY      Cardiovascular:  Calcium Channel Blockers Failed - 01/07/2020 12:50 PM      Failed - Last BP in normal range    BP Readings from Last 1 Encounters:  11/29/19 (!) 158/80          Failed - Valid encounter within last 6 months    Recent Outpatient Visits           1 year ago Essential hypertension   [facility] [place], [place], [place]   1 year ago Essential hypertension   [facility], [place], [place]   2 years ago Herpes zoster without complication   [facility], [place], [place]   2 years ago Flu-like symptoms   [facility], [place], [place]   2 years ago BMI 37.0-37.9, adult   [facility], [place], [place]

## 2020-01-10 ENCOUNTER — Other Ambulatory Visit: Payer: Self-pay

## 2020-01-10 ENCOUNTER — Ambulatory Visit (INDEPENDENT_AMBULATORY_CARE_PROVIDER_SITE_OTHER): Payer: BC Managed Care – PPO

## 2020-01-10 ENCOUNTER — Ambulatory Visit (INDEPENDENT_AMBULATORY_CARE_PROVIDER_SITE_OTHER): Payer: BC Managed Care – PPO | Admitting: Certified Nurse Midwife

## 2020-01-10 ENCOUNTER — Encounter: Payer: Self-pay | Admitting: Certified Nurse Midwife

## 2020-01-10 VITALS — BP 130/80 | HR 84 | Ht 68.0 in | Wt 238.0 lb

## 2020-01-10 DIAGNOSIS — N83201 Unspecified ovarian cyst, right side: Secondary | ICD-10-CM

## 2020-01-10 DIAGNOSIS — Z975 Presence of (intrauterine) contraceptive device: Secondary | ICD-10-CM | POA: Diagnosis not present

## 2020-01-10 DIAGNOSIS — N938 Other specified abnormal uterine and vaginal bleeding: Secondary | ICD-10-CM

## 2020-01-10 DIAGNOSIS — D259 Leiomyoma of uterus, unspecified: Secondary | ICD-10-CM | POA: Diagnosis not present

## 2020-01-12 ENCOUNTER — Encounter: Payer: Self-pay | Admitting: Certified Nurse Midwife

## 2020-01-12 NOTE — Progress Notes (Addendum)
  [id number] Amanda Ibarra is a 49 year old Caucasian/White female, G1 P1001, who presents for a follow up ultrasound. She had presented on 11/29/19 with complaints of pelvic pain and prolonged spotting. She reported some left lower quadrant pain with the spotting. She had an ultrasound at that time which revealed multiple fibroids (none were submucosal), an IUD that was in place, and a 5.4cm right ovarian cyst.  Since then she had a a menses in February that lasted 10 days and her LMP on 28 February lasted only 4 days and was more normal. Her lower abdominal pain has resolved. She had a follow up ultrasound for her right ovarian cyst   Ultrasound today demonstrates resolution of the the right ovarian cyst  PMHx: She  has a past medical history of CIN I (cervical intraepithelial neoplasia I), Hypertension, Menorrhagia, and Vitamin D deficiency. Also,  has a past surgical history that includes Wisdon teeth; Colposcopy (10/09/1996); Dilation and curettage of uterus (11/2011); Cervical biopsy w/ loop electrode excision (12/12/1995); Tonsillectomy; and IUD MIrena., family history includes Arthritis/Rheumatoid in her mother; Bone cancer in her maternal grandmother; Breast cancer (age of onset: 47) in her mother; Gout in her maternal grandfather; Heart attack in her maternal grandfather; Hyperlipidemia in her mother; Hypertension in her father and mother; Thyroid disease in her mother.,  reports that she has never smoked. She has never used smokeless tobacco. She reports current alcohol use. She reports that she does not use drugs.  She has a current medication list which includes the following prescription(s): amlodipine, turmeric curcumin, vitamin d, fluticasone, lisinopril-hydrochlorothiazide, multiple vitamin, and levonorgestrel. Also, is allergic to azithromycin; codeine; and penicillins.  ROS  Objective: BP 130/80   Pulse 84   Ht 5\' 8"  (1.727 m)   Wt 238 lb (108 kg)   LMP 01/05/2020   BMI 36.19 kg/m     Physical examination Constitutional NAD, Conversant  Skin No rashes, lesions or ulceration.   Extremities: Moves all appropriately.  Normal ROM for age.  Neuro: Grossly intact  Psych: Oriented to PPT.  Normal mood. Normal affect.   Assessment:  Right ovarian cyst and AUB has resolved.  Plan: Menstrual calendar RTN for annual exam and prn  Dalia Heading, CNM. I spent 10 minutes in face to face discussion with client regarding ultrasound findings and current symptoms.

## 2020-03-16 ENCOUNTER — Other Ambulatory Visit: Payer: Self-pay | Admitting: Certified Nurse Midwife

## 2020-03-16 DIAGNOSIS — Z1231 Encounter for screening mammogram for malignant neoplasm of breast: Secondary | ICD-10-CM

## 2020-04-21 ENCOUNTER — Ambulatory Visit
Admission: RE | Admit: 2020-04-21 | Discharge: 2020-04-21 | Disposition: A | Discharge status: Home / Self Care | Payer: BC Managed Care – PPO | Source: Ambulatory Visit | Attending: Certified Nurse Midwife | Admitting: Certified Nurse Midwife

## 2020-04-21 ENCOUNTER — Other Ambulatory Visit: Payer: Self-pay

## 2020-04-21 DIAGNOSIS — Z1231 Encounter for screening mammogram for malignant neoplasm of breast: Secondary | ICD-10-CM | POA: Insufficient documentation

## 2020-04-24 ENCOUNTER — Encounter: Payer: Self-pay | Admitting: Certified Nurse Midwife

## 2020-04-24 ENCOUNTER — Telehealth: Payer: Self-pay | Admitting: Certified Nurse Midwife

## 2020-04-24 ENCOUNTER — Telehealth: Payer: Self-pay

## 2020-04-24 DIAGNOSIS — N939 Abnormal uterine and vaginal bleeding, unspecified: Secondary | ICD-10-CM

## 2020-04-24 NOTE — Telephone Encounter (Signed)
Hx of abnormal uterine bleeding. Has a Mirena IUD in place. Seen twice this year. Had a prolonged menses with pelvic pain in January. Had an ultrasound at that time which revealed an EM stripe of 13 mm, subserosal and intramural fibroids, and a 5cm ovarian cyst. The ovarian cyst resolved on the follow up ultrasound. She has noticed her menses lately have been preceded by spotting. The last menses started 5 June and she has continued to bleed intermittently since then. She will have spurts of bleeding and then not see any bleeding for hours. Has been dealing with bleeding issues since 2012. This is her second IUD for tx of AUB. Husband has vasectomy. She is interested in definitive treatment of this problem. Discussed case with Dr Glennon Mac. Will schedule another ultrasound to measure endometrial stripe and a follow up with Dr Glennon Mac. May need an endometrial biopsy at that time. She is interested in having a hysterectomy, and I have encouraged her to discuss this with Dr Glennon Mac.  Patient agrees with plan of management.  Dalia Heading, CNM

## 2020-04-24 NOTE — Telephone Encounter (Signed)
Pt calling; has been bleeding for two weeks; has fibroids per u/s in Jan and March; is this cause for concern; should she come in?; what to do?  [phone]

## 2020-04-27 ENCOUNTER — Other Ambulatory Visit: Payer: Self-pay

## 2020-04-27 ENCOUNTER — Ambulatory Visit (INDEPENDENT_AMBULATORY_CARE_PROVIDER_SITE_OTHER): Payer: BC Managed Care – PPO | Admitting: Obstetrics and Gynecology

## 2020-04-27 ENCOUNTER — Ambulatory Visit (INDEPENDENT_AMBULATORY_CARE_PROVIDER_SITE_OTHER): Payer: BC Managed Care – PPO

## 2020-04-27 VITALS — BP 148/96 | Ht 68.0 in | Wt 240.0 lb

## 2020-04-27 DIAGNOSIS — N939 Abnormal uterine and vaginal bleeding, unspecified: Secondary | ICD-10-CM

## 2020-04-27 NOTE — Progress Notes (Signed)
Gynecology Ultrasound Follow Up   Chief Complaint  Patient presents with   Follow-up  U/S for abnormal uterine bleeding  History of Present Illness: Patient is a 49 y.o. female who presents today for ultrasound evaluation of the above.  Ultrasound demonstrates the following findings Adnexa: small complex ovarian cyst on right ovary (history of cyst on right ovary that resolved in March)  Uterus: retroverted with endometrial stripe  6.6 mm Additional: 3 small fibroids.    She has had vaginal bleeding that started 6/5 that ended recently.  She had a small amount of spotting yesterday. No bleeding today.  She has a history of abnormal bleeding, a history of an endometrial polyp, and has had an IUD.   She has no pain or cramps.  Her current IUD was placed in 09/2017. This is her second one placed.    She doesn't want to do anything drastic, if not necessary.    Past Medical History:  Diagnosis Date   CIN I (cervical intraepithelial neoplasia I)    Cyst of right ovary 11/30/2019   Hypertension    Menorrhagia    Vitamin D deficiency     Past Surgical History:  Procedure Laterality Date   CERVICAL BIOPSY  W/ LOOP ELECTRODE EXCISION  12/12/1995   COLPOSCOPY  10/09/1996   DILATION AND CURETTAGE OF UTERUS  11/2011   for menorrhagia   IUD MIrena     TONSILLECTOMY     Wisdon teeth      Family History  Problem Relation Age of Onset   Hypertension Father    Arthritis/Rheumatoid Mother    Hyperlipidemia Mother    Hypertension Mother    Thyroid disease Mother    Breast cancer Mother 30   Bone cancer Maternal Grandmother    Heart attack Maternal Grandfather    Gout Maternal Grandfather     Social History   Socioeconomic History   Marital status: Married    Spouse name: Not on file   Number of children: 1   Years of education: Bachelors   Highest education level: Not on file  Occupational History    Employer: [employer]    Comment:  Full Time  Tobacco Use   Smoking status: Never Smoker   Smokeless tobacco: Never Used  Vaping Use   Vaping Use: Never used  Substance and Sexual Activity   Alcohol use: Yes    Comment: very rarely   Drug use: No   Sexual activity: Yes    Partners: Male    Birth control/protection: I.U.D.  Other Topics Concern   Not on file  Social History Narrative   Not on file   Social Determinants of Health   Financial Resource Strain:    Difficulty of Paying Living Expenses:   Food Insecurity:    Worried About Charity fundraiser in the Last Year:    Arboriculturist in the Last Year:   Transportation Needs:    Film/video editor (Medical):    Lack of Transportation (Non-Medical):   Physical Activity:    Days of Exercise per Week:    Minutes of Exercise per Session:   Stress:    Feeling of Stress :   Social Connections:    Frequency of Communication with Friends and Family:    Frequency of Social Gatherings with Friends and Family:    Attends Religious Services:    Active Member of Clubs or Organizations:    Attends Archivist Meetings:  Marital Status:   Intimate Partner Violence:    Fear of Current or Ex-Partner:    Emotionally Abused:    Physically Abused:    Sexually Abused:     Allergies  Allergen Reactions   Azithromycin Other (See Comments)    Other reaction(s): Vomiting   Codeine Other (See Comments)    unknown   Penicillins Rash    Prior to Admission medications   Medication Sig Start Date End Date Taking? Authorizing Provider  amLODipine (NORVASC) 10 MG tablet TAKE ONE TABLET EVERY DAY 01/09/20   Mar Daring, PA-C  Black Pepper-Turmeric (TURMERIC CURCUMIN) 03-999 MG CAPS Take 5-1,000 mg by mouth daily.    [provider]  Cholecalciferol (VITAMIN D) 2000 UNITS tablet Take 2,000 Units by mouth daily.    [provider]  fluticasone [facility]) 50 MCG/ACT nasal spray SPRAY TWICE INTO EACH  NOSTRIL ONCE DAILY 10/05/18   Jerrol Banana., MD  levonorgestrel [facility]) 20 MCG/24HR IUD once for 1 dose by Intrauterine route. 09/15/17 09/15/17  Dalia Heading, CNM  lisinopril-hydrochlorothiazide (ZESTORETIC) 20-12.5 MG tablet Take 1 tablet by mouth daily. 04/30/19 04/29/20  [provider]  MULTIPLE VITAMIN PO Take 1 tablet by mouth daily.    [provider]    Physical Exam BP (!) 148/96    Ht 5\' 8"  (1.727 m)    Wt 240 lb (108.9 kg)    BMI 36.49 kg/m    General: NAD HEENT: normocephalic, anicteric Pulmonary: No increased work of breathing Extremities: no edema, erythema, or tenderness Neurologic: Grossly intact, normal gait Psychiatric: mood appropriate, affect full  Imaging Results US PELVIS TRANSVAGINAL NON-OB (TV ONLY)  Result Date: 04/27/2020 Patient Name: Amanda Ibarra DOB: 06-May-1971 MRN: [id number] ULTRASOUND REPORT Location: [facility] Date of Service: 04/27/2020 Indications:Abnormal Uterine Bleeding Findings: The uterus is retroverted and measures 8.6 x 6.9 x 5.4 cm. Echo texture is heterogenous with evidence of focal masses. Within the uterus are multiple suspected fibroids measuring: Fibroid 1: 19.8 x 19.5 x 19.3 mm  Posterior intramural Fibroid 2: 17.3 x 16.5 x 17.5 mm intramural right Fibroid 3: 11.8 x 10.6 x 8.9 mm intramural left The Endometrium measures 6.6 mm. The IUD is correctly placed within the uterus. Right Ovary measures 4.0 x 2.5 x 2.4 cm. There is a simple vs. Complex cyst in the right ovary measuring 26.2 x 19.2 x 22.7 mm. No blood flow is seen within. Left Ovary measures 3.6 x 2.1 x 1.5 cm. It is normal in appearance. Survey of the adnexa demonstrates no adnexal masses. There is no free fluid in the cul de sac. Impression: 1. The IUD is correctly placed within the uterus. 2. There are three intramural fibroids seen. 3. There is a 2.6 cm cyst in the right ovary. Gweneth Dimitri, RT The ultrasound images and findings were reviewed by  me and I agree with the above report. Prentice Docker, MD, [facility], [facility] 04/27/2020 3:15 PM      Assessment: 49 y.o. G1P1001  1. Abnormal uterine bleeding      Plan: Problem List Items Addressed This Visit    None    Visit Diagnoses    Abnormal uterine bleeding    -  Primary     Track menses for next several months. Discuss other management options, prn.   After long discussion today, patient not interested in drastic treatment options at this time. At least for today she seems OK with her current bleeding pattern.  She will continue to track and let me know if she changes her mind.  We discussed management options ranging from continuing her current treatment to ablation or hysterectomy.  Given her endometrial stripe is normal today and she has an IUD in place, will forgo endometrial biopsy as her risk is extremely low for neoplasia or hyperplasia.  A total of 25 minutes were spent face-to-face with the patient as well as preparation, review, communication, and documentation during this encounter.    Prentice Docker, MD, [facility], [facility] 04/28/2020 8:36 AM

## 2020-04-28 ENCOUNTER — Encounter: Payer: Self-pay | Admitting: Obstetrics and Gynecology

## 2020-05-05 ENCOUNTER — Ambulatory Visit: Payer: BC Managed Care – PPO | Admitting: Certified Nurse Midwife

## 2020-05-12 DIAGNOSIS — M10071 Idiopathic gout, right ankle and foot: Secondary | ICD-10-CM | POA: Diagnosis not present

## 2020-05-12 DIAGNOSIS — Z79899 Other long term (current) drug therapy: Secondary | ICD-10-CM | POA: Diagnosis not present

## 2020-06-04 ENCOUNTER — Other Ambulatory Visit: Payer: Self-pay

## 2020-06-04 ENCOUNTER — Ambulatory Visit (INDEPENDENT_AMBULATORY_CARE_PROVIDER_SITE_OTHER): Payer: BC Managed Care – PPO | Admitting: Certified Nurse Midwife

## 2020-06-04 ENCOUNTER — Encounter: Payer: Self-pay | Admitting: Certified Nurse Midwife

## 2020-06-04 VITALS — BP 150/80 | HR 90 | Ht 68.0 in | Wt 238.0 lb

## 2020-06-04 DIAGNOSIS — Z01419 Encounter for gynecological examination (general) (routine) without abnormal findings: Secondary | ICD-10-CM

## 2020-06-04 DIAGNOSIS — N939 Abnormal uterine and vaginal bleeding, unspecified: Secondary | ICD-10-CM | POA: Diagnosis not present

## 2020-06-04 NOTE — Progress Notes (Addendum)
Gynecology Annual Exam  PCP: Derinda Late, MD  Chief Complaint:  Chief Complaint  Patient presents with  . Gynecologic Exam    History of Present Illness: Amanda Ibarra. Pellegrino is a 49 year old Caucasian/White female, G1 P1001, who presents for her annual exam. She has been seen several times over the past year for AUB and pelvic pain (has had AUB since 2012). She had a 5 cm right ovarian cyst that resolved and multiple subserosal and intramural fibroids. She had another ultrasound in June after having a prolonged menstrual cycle and  followed up with Dr Glennon Mac. She had a small 2.6 cm right ovarian cyst, an EM stripe measuring 6.10mm and her IUD was iplace.  She decided against any operative treatment at that time. She has had one menses since her last visit. It began 05/14/2020 and lasted 9-10 days, was light to medium flow.    She reports mild dysmenorrhea x 1 day. Does not require medication. The patient's past medical history is notable for a history of hypertension, kidney stones,  and menorrhagia. In 2013 she had a D&C and hysteroscopy for menorrhagia and path revealed polypoid tissue. She had a Mirena inserted 07/18/2012 for tx of menorrhagia and it was replaced in November 2018. THe following month a ultrasound was done when the IUD strings were not visible and the IUD was noted to be in the correct location. 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 Since her last annual GYN exam dated 04/30/2019, she has also been diagnosed with gout. She is sexually active. She is currently using a vasectomy for contraception.  Her most recent pap smear was obtained 04/30/2019 and was NIL. Her most recent mammogram obtained on 04/21/2020  was negative.  There is a family history of breast cancer in her mother and genetic testing has not been done. There is no family history of ovarian cancer.  The patient does do monthly self breast exams.  The patient does not  smoke.  The patient does drink infrequently.  The patient does not use illegal drugs.  The patient has started back exercising by walking or biking.Currentt BMI is 36.2 kg/m2 The patient does get adequate calcium in her diet and she is taking vitamin D3 supplements  She has  had a recent cholesterol screen at PCP 09/27/2018 and it was borderline.    Review of Systems: Review of Systems  Constitutional: Negative for chills, fever and weight loss.  HENT: Negative for congestion, sinus pain and sore throat.   Eyes: Negative for blurred vision and pain.  Respiratory: Negative for cough, hemoptysis, sputum production, shortness of breath and wheezing.   Cardiovascular: Negative for chest pain, palpitations and leg swelling.  Gastrointestinal: Negative for abdominal pain, blood in stool, diarrhea, heartburn, nausea and vomiting.  Genitourinary: Negative for dysuria, frequency, hematuria and urgency.       Positive for prolonged menses  Musculoskeletal: Negative for back pain, joint pain and myalgias.  Skin: Negative for itching and rash.  Neurological: Negative for dizziness, tingling and headaches.  Endo/Heme/Allergies: Negative for environmental allergies and polydipsia. Does not bruise/bleed easily.       Negative for hirsutism   Psychiatric/Behavioral: Negative for depression. The patient is nervous/anxious. The patient does not have insomnia.     Past Medical History:  Past Medical History:  Diagnosis Date  . CIN I (cervical intraepithelial neoplasia I)   . Cyst of right ovary 11/30/2019  .  Gout   . Hypertension   . Menorrhagia   . Vitamin D deficiency     Past Surgical History:  Past Surgical History:  Procedure Laterality Date  . CERVICAL BIOPSY  W/ LOOP ELECTRODE EXCISION  12/12/1995  . COLPOSCOPY  10/09/1996  . DILATION AND CURETTAGE OF UTERUS  11/2011   for menorrhagia  . IUD MIrena    . TONSILLECTOMY    . Wisdon teeth      Family History:  Family History    Problem Relation Age of Onset  . Hypertension Father   . Arthritis/Rheumatoid Mother   . Hyperlipidemia Mother   . Hypertension Mother   . Thyroid disease Mother   . Breast cancer Mother 85  . Bone cancer Maternal Grandmother   . Heart attack Maternal Grandfather   . Gout Maternal Grandfather     Social History:  Social History   Socioeconomic History  . Marital status: Married    Spouse name: Not on file  . Number of children: 1  . Years of education: Bachelors  . Highest education level: Not on file  Occupational History    Employer: [employer]    Comment: Full Time  Tobacco Use  . Smoking status: Never Smoker  . Smokeless tobacco: Never Used  Vaping Use  . Vaping Use: Never used  Substance and Sexual Activity  . Alcohol use: Yes    Comment: very rarely  . Drug use: No  . Sexual activity: Yes    Partners: Male    Birth control/protection: I.U.D.  Other Topics Concern  . Not on file  Social History Narrative  . Not on file   Social Determinants of Health   Financial Resource Strain:   . Difficulty of Paying Living Expenses:   Food Insecurity:   . Worried About Charity fundraiser in the Last Year:   . Arboriculturist in the Last Year:   Transportation Needs:   . Film/video editor (Medical):   Marland Kitchen Lack of Transportation (Non-Medical):   Physical Activity:   . Days of Exercise per Week:   . Minutes of Exercise per Session:   Stress:   . Feeling of Stress :   Social Connections:   . Frequency of Communication with Friends and Family:   . Frequency of Social Gatherings with Friends and Family:   . Attends Religious Services:   . Active Member of Clubs or Organizations:   . Attends Archivist Meetings:   Marland Kitchen Marital Status:   Intimate Partner Violence:   . Fear of Current or Ex-Partner:   . Emotionally Abused:   Marland Kitchen Physically Abused:   . Sexually Abused:     Allergies:  Allergies  Allergen Reactions  . Azithromycin Other (See  Comments)    Other reaction(s): Vomiting  . Codeine Other (See Comments)    unknown  . Penicillins Rash    Medications:  Current Outpatient Medications on File Prior to Visit  Medication Sig Dispense Refill  . amLODipine (NORVASC) 10 MG tablet TAKE ONE TABLET EVERY DAY 90 tablet 1  . Black Pepper-Turmeric (TURMERIC CURCUMIN) 03-999 MG CAPS Take 5-1,000 mg by mouth daily.    . Cholecalciferol (VITAMIN D) 2000 UNITS tablet Take 2,000 Units by mouth daily.    . colchicine 0.6 MG tablet Take 2 tablets by mouth as needed.    . fluticasone (FLONASE) 50 MCG/ACT nasal spray SPRAY TWICE INTO EACH NOSTRIL ONCE DAILY 16 g 3  . MULTIPLE VITAMIN PO  Take 1 tablet by mouth daily.    Marland Kitchen levonorgestrel (MIRENA) 20 MCG/24HR IUD once for 1 dose by Intrauterine route. 1 each 0  . lisinopril-hydrochlorothiazide (ZESTORETIC) 20-12.5 MG tablet Take 1 tablet by mouth daily.     No current facility-administered medications on file prior to visit.     Physical Exam Vitals: BP (!) 150/80   Pulse 90   Ht 5\' 8"  (1.727 m)   Wt (!) 238 lb (108 kg)   LMP 05/14/2020 (Exact Date)   BMI 36.19 kg/m   General: WF in NAD HEENT: normocephalic, anicteric Thyroid: no enlargement, no palpable nodules Pulmonary: No increased work of breathing, CTAB  Cardiovascular: RRR without murmur Breast: Breast symmetrical, no tenderness, no palpable nodules or masses, no skin or nipple retraction present, no nipple discharge.  No axillary, infraclavicular, or supraclavicular lymphadenopathy. Abdomen: soft, non-tender, non-distended, obese.  Umbilicus without lesions.  No hepatomegaly or masses palpable. No evidence of hernia  Genitourinary:  External: Normal external female genitalia.  Normal urethral meatus, normal Bartholin's and Skene's glands.    Vagina: Normal vaginal mucosa, no evidence of prolapse.    Cervix: Grossly normal in appearance, no bleeding, IUD strings not visible, no CMT  Uterus: Retroflexed, decreased  mobility, NT, irregular contour of posterior wall of uterus  Adnexa:  no adnexal masses, NT  Rectal: deferred  Lymphatic: no evidence of inguinal lymphadenopathy Extremities: no edema, erythema, or tenderness Neurologic: Grossly intact Psychiatric: mood appropriate, affect full      Assessment: 49 y.o. G1P1001 gyn exam IUD strings not visible, but with a recent ultrasound showing the IUD in place. AUB- Vasomotor symptoms-will get FSH, LH   Plan:   1) Breast cancer screening: Mammogram - recommend continued yearly screening mammogram. Mammogram UTD  2) Cervical cancer screening: Pap not  Done. Desires Pap every 3 years  3) Contraception- vasectomy  4) Routine healthcare maintenance including cholesterol, diabetes screening  managed by PCP   5) Colon cancer screening: Discussed recent recommendations to start colon cancer screening at age 23 and options for colon cancer screening including FIT testing, Cologuard, colonoscopy. Patient would like to start colonoscopies next year.  6) RTO in one year for her annual exam and prn if she desires any definitive treatment of AUB.  Dalia Heading, CNM

## 2020-06-05 ENCOUNTER — Encounter: Payer: Self-pay | Admitting: Certified Nurse Midwife

## 2020-06-05 LAB — FSH/LH
FSH: 2.3 m[IU]/mL
LH: 4.2 m[IU]/mL

## 2020-06-07 DIAGNOSIS — N939 Abnormal uterine and vaginal bleeding, unspecified: Secondary | ICD-10-CM | POA: Insufficient documentation

## 2020-06-12 ENCOUNTER — Telehealth: Payer: Self-pay

## 2020-06-12 NOTE — Telephone Encounter (Signed)
Patient is schedule 8/19/21with SDJ

## 2020-06-12 NOTE — Telephone Encounter (Signed)
Please call pt to schedule a appointment with SDJ about her heavy bleeding.

## 2020-06-25 ENCOUNTER — Other Ambulatory Visit: Payer: Self-pay

## 2020-06-25 ENCOUNTER — Encounter: Payer: Self-pay | Admitting: Obstetrics and Gynecology

## 2020-06-25 ENCOUNTER — Ambulatory Visit: Payer: BC Managed Care – PPO | Admitting: Obstetrics and Gynecology

## 2020-06-25 ENCOUNTER — Telehealth: Payer: Self-pay | Admitting: Obstetrics and Gynecology

## 2020-06-25 ENCOUNTER — Ambulatory Visit (INDEPENDENT_AMBULATORY_CARE_PROVIDER_SITE_OTHER): Payer: BC Managed Care – PPO | Admitting: Obstetrics and Gynecology

## 2020-06-25 VITALS — BP 147/89 | HR 118 | Ht 68.0 in | Wt 236.0 lb

## 2020-06-25 DIAGNOSIS — D252 Subserosal leiomyoma of uterus: Secondary | ICD-10-CM

## 2020-06-25 DIAGNOSIS — D251 Intramural leiomyoma of uterus: Secondary | ICD-10-CM

## 2020-06-25 DIAGNOSIS — N920 Excessive and frequent menstruation with regular cycle: Secondary | ICD-10-CM

## 2020-06-25 NOTE — Progress Notes (Signed)
Obstetrics & Gynecology Office Visit    Chief Complaint  Patient presents with  . Menorrhagia    several times a month, x few months   History of Present Illness: 49 y.o. G1P1001 who presents in follow up for menorrhagia.  Since her last visit with me she has more periods, lasting longer. She feels like she is going backward in terms of bleeding. She also feels like this has been progressing quickly.   She has had multiple conversations about this in the recent past.   Past Medical History:  Diagnosis Date  . CIN I (cervical intraepithelial neoplasia I)   . Cyst of right ovary 11/30/2019  . Gout   . Hypertension   . Menorrhagia   . Vitamin D deficiency     Past Surgical History:  Procedure Laterality Date  . CERVICAL BIOPSY  W/ LOOP ELECTRODE EXCISION  12/12/1995  . COLPOSCOPY  10/09/1996  . DILATION AND CURETTAGE OF UTERUS  11/2011   for menorrhagia  . IUD MIrena    . TONSILLECTOMY    . Wisdon teeth      Gynecologic History: Patient's last menstrual period was 06/07/2020.  Obstetric History: G1P1001  Family History  Problem Relation Age of Onset  . Hypertension Father   . Arthritis/Rheumatoid Mother   . Hyperlipidemia Mother   . Hypertension Mother   . Thyroid disease Mother   . Breast cancer Mother 7  . Bone cancer Maternal Grandmother   . Heart attack Maternal Grandfather   . Gout Maternal Grandfather     Social History   Socioeconomic History  . Marital status: Married    Spouse name: Not on file  . Number of children: 1  . Years of education: Bachelors  . Highest education level: Not on file  Occupational History    Employer: [employer]    Comment: Full Time  Tobacco Use  . Smoking status: Never Smoker  . Smokeless tobacco: Never Used  Vaping Use  . Vaping Use: Never used  Substance and Sexual Activity  . Alcohol use: Yes    Comment: very rarely  . Drug use: No  . Sexual activity: Yes    Partners: Male    Birth  control/protection: I.U.D.  Other Topics Concern  . Not on file  Social History Narrative  . Not on file   Social Determinants of Health   Financial Resource Strain:   . Difficulty of Paying Living Expenses: Not on file  Food Insecurity:   . Worried About Charity fundraiser in the Last Year: Not on file  . Ran Out of Food in the Last Year: Not on file  Transportation Needs:   . Lack of Transportation (Medical): Not on file  . Lack of Transportation (Non-Medical): Not on file  Physical Activity:   . Days of Exercise per Week: Not on file  . Minutes of Exercise per Session: Not on file  Stress:   . Feeling of Stress : Not on file  Social Connections:   . Frequency of Communication with Friends and Family: Not on file  . Frequency of Social Gatherings with Friends and Family: Not on file  . Attends Religious Services: Not on file  . Active Member of Clubs or Organizations: Not on file  . Attends Archivist Meetings: Not on file  . Marital Status: Not on file  Intimate Partner Violence:   . Fear of Current or Ex-Partner: Not on file  . Emotionally Abused: Not on file  .  Physically Abused: Not on file  . Sexually Abused: Not on file    Allergies  Allergen Reactions  . Azithromycin Other (See Comments)    Other reaction(s): Vomiting  . Codeine Other (See Comments)    unknown  . Penicillins Rash    Prior to Admission medications   Medication Sig Start Date End Date Taking? Authorizing Provider  amLODipine (NORVASC) 10 MG tablet TAKE ONE TABLET EVERY DAY 01/09/20  Yes Burnette, Anderson Malta M, PA-C  Black Pepper-Turmeric (TURMERIC CURCUMIN) 03-999 MG CAPS Take 5-1,000 mg by mouth daily.   Yes [provider]  Cholecalciferol (VITAMIN D) 2000 UNITS tablet Take 2,000 Units by mouth daily.   Yes [provider]  colchicine 0.6 MG tablet Take 2 tablets by mouth as needed. 05/12/20  Yes [provider]  fluticasone (FLONASE) 50 MCG/ACT nasal spray  SPRAY TWICE INTO EACH NOSTRIL ONCE DAILY 10/05/18  Yes Jerrol Banana., MD  lisinopril-hydrochlorothiazide (ZESTORETIC) 20-12.5 MG tablet Take 1 tablet by mouth daily. 04/30/19 06/25/20 Yes [provider]  MULTIPLE VITAMIN PO Take 1 tablet by mouth daily.   Yes [provider]  levonorgestrel (MIRENA) 20 MCG/24HR IUD once for 1 dose by Intrauterine route. 09/15/17 09/15/17  Dalia Heading, CNM    Review of Systems  Constitutional: Negative.   HENT: Negative.   Eyes: Negative.   Respiratory: Negative.   Cardiovascular: Negative.   Gastrointestinal: Negative.   Genitourinary: Negative.   Musculoskeletal: Negative.   Skin: Negative.   Neurological: Negative.   Psychiatric/Behavioral: Negative.      Physical Exam BP (!) 147/89   Pulse (!) 118   Ht 5\' 8"  (1.727 m)   Wt 236 lb (107 kg)   LMP 06/07/2020   BMI 35.88 kg/m  Patient's last menstrual period was 06/07/2020. Physical Exam Constitutional:      General: She is not in acute distress.    Appearance: Normal appearance. She is well-developed.  HENT:     Head: Normocephalic and atraumatic.  Eyes:     General: No scleral icterus.    Conjunctiva/sclera: Conjunctivae normal.  Cardiovascular:     Rate and Rhythm: Normal rate and regular rhythm.     Heart sounds: No murmur heard.  No friction rub. No gallop.   Pulmonary:     Effort: Pulmonary effort is normal. No respiratory distress.     Breath sounds: Normal breath sounds. No wheezing or rales.  Musculoskeletal:        General: Normal range of motion.     Cervical back: Normal range of motion and neck supple.  Neurological:     General: No focal deficit present.     Mental Status: She is alert and oriented to person, place, and time.     Cranial Nerves: No cranial nerve deficit.  Skin:    General: Skin is warm and dry.     Findings: No erythema.  Psychiatric:        Mood and Affect: Mood normal.        Behavior: Behavior normal.         Judgment: Judgment normal.   Female chaperone present for pelvic and breast  portions of the physical exam  Assessment: 49 y.o. G59P1001 female here for No diagnosis found.   Plan: Problem List Items Addressed This Visit    None       ICD-10-CM   1. Menorrhagia with regular cycle  N92.0   2. Intramural and subserous leiomyoma of uterus  D25.1  D25.2     Long discussion held around options for treatment going forward.  She wants to do something different than her IUD.  Discussed endometrial ablation, uterine fibroid embolization, and hysterectomy. She would like to move forward quickly with hysterectomy.  Reviewed the procedure in detail.  Consents obtained today, in case we can get the surgery schedule for soon.  All questions answered.   A total of 25 minutes were spent face-to-face with the patient as well as preparation, review, communication, and documentation during this encounter.    Prentice Docker, MD 06/25/2020 12:32 PM

## 2020-06-25 NOTE — H&P (View-Only) (Signed)
Obstetrics & Gynecology Office Visit    Chief Complaint  Patient presents with  . Menorrhagia    several times a month, x few months   History of Present Illness: 49 y.o. G1P1001 who presents in follow up for menorrhagia.  Since her last visit with me she has more periods, lasting longer. She feels like she is going backward in terms of bleeding. She also feels like this has been progressing quickly.   She has had multiple conversations about this in the recent past.   Past Medical History:  Diagnosis Date  . CIN I (cervical intraepithelial neoplasia I)   . Cyst of right ovary 11/30/2019  . Gout   . Hypertension   . Menorrhagia   . Vitamin D deficiency     Past Surgical History:  Procedure Laterality Date  . CERVICAL BIOPSY  W/ LOOP ELECTRODE EXCISION  12/12/1995  . COLPOSCOPY  10/09/1996  . DILATION AND CURETTAGE OF UTERUS  11/2011   for menorrhagia  . IUD MIrena    . TONSILLECTOMY    . Wisdon teeth      Gynecologic History: Patient's last menstrual period was 06/07/2020.  Obstetric History: G1P1001  Family History  Problem Relation Age of Onset  . Hypertension Father   . Arthritis/Rheumatoid Mother   . Hyperlipidemia Mother   . Hypertension Mother   . Thyroid disease Mother   . Breast cancer Mother 74  . Bone cancer Maternal Grandmother   . Heart attack Maternal Grandfather   . Gout Maternal Grandfather     Social History   Socioeconomic History  . Marital status: Married    Spouse name: Not on file  . Number of children: 1  . Years of education: Bachelors  . Highest education level: Not on file  Occupational History    Employer: [employer]    Comment: Full Time  Tobacco Use  . Smoking status: Never Smoker  . Smokeless tobacco: Never Used  Vaping Use  . Vaping Use: Never used  Substance and Sexual Activity  . Alcohol use: Yes    Comment: very rarely  . Drug use: No  . Sexual activity: Yes    Partners: Male    Birth  control/protection: I.U.D.  Other Topics Concern  . Not on file  Social History Narrative  . Not on file   Social Determinants of Health   Financial Resource Strain:   . Difficulty of Paying Living Expenses: Not on file  Food Insecurity:   . Worried About Charity fundraiser in the Last Year: Not on file  . Ran Out of Food in the Last Year: Not on file  Transportation Needs:   . Lack of Transportation (Medical): Not on file  . Lack of Transportation (Non-Medical): Not on file  Physical Activity:   . Days of Exercise per Week: Not on file  . Minutes of Exercise per Session: Not on file  Stress:   . Feeling of Stress : Not on file  Social Connections:   . Frequency of Communication with Friends and Family: Not on file  . Frequency of Social Gatherings with Friends and Family: Not on file  . Attends Religious Services: Not on file  . Active Member of Clubs or Organizations: Not on file  . Attends Archivist Meetings: Not on file  . Marital Status: Not on file  Intimate Partner Violence:   . Fear of Current or Ex-Partner: Not on file  . Emotionally Abused: Not on file  .  Physically Abused: Not on file  . Sexually Abused: Not on file    Allergies  Allergen Reactions  . Azithromycin Other (See Comments)    Other reaction(s): Vomiting  . Codeine Other (See Comments)    unknown  . Penicillins Rash    Prior to Admission medications   Medication Sig Start Date End Date Taking? Authorizing Provider  amLODipine (NORVASC) 10 MG tablet TAKE ONE TABLET EVERY DAY 01/09/20  Yes Burnette, Anderson Malta M, PA-C  Black Pepper-Turmeric (TURMERIC CURCUMIN) 03-999 MG CAPS Take 5-1,000 mg by mouth daily.   Yes [provider]  Cholecalciferol (VITAMIN D) 2000 UNITS tablet Take 2,000 Units by mouth daily.   Yes [provider]  colchicine 0.6 MG tablet Take 2 tablets by mouth as needed. 05/12/20  Yes [provider]  fluticasone (FLONASE) 50 MCG/ACT nasal spray  SPRAY TWICE INTO EACH NOSTRIL ONCE DAILY 10/05/18  Yes Jerrol Banana., MD  lisinopril-hydrochlorothiazide (ZESTORETIC) 20-12.5 MG tablet Take 1 tablet by mouth daily. 04/30/19 06/25/20 Yes [provider]  MULTIPLE VITAMIN PO Take 1 tablet by mouth daily.   Yes [provider]  levonorgestrel (MIRENA) 20 MCG/24HR IUD once for 1 dose by Intrauterine route. 09/15/17 09/15/17  Dalia Heading, CNM    Review of Systems  Constitutional: Negative.   HENT: Negative.   Eyes: Negative.   Respiratory: Negative.   Cardiovascular: Negative.   Gastrointestinal: Negative.   Genitourinary: Negative.   Musculoskeletal: Negative.   Skin: Negative.   Neurological: Negative.   Psychiatric/Behavioral: Negative.      Physical Exam BP (!) 147/89   Pulse (!) 118   Ht 5\' 8"  (1.727 m)   Wt 236 lb (107 kg)   LMP 06/07/2020   BMI 35.88 kg/m  Patient's last menstrual period was 06/07/2020. Physical Exam Constitutional:      General: She is not in acute distress.    Appearance: Normal appearance. She is well-developed.  HENT:     Head: Normocephalic and atraumatic.  Eyes:     General: No scleral icterus.    Conjunctiva/sclera: Conjunctivae normal.  Cardiovascular:     Rate and Rhythm: Normal rate and regular rhythm.     Heart sounds: No murmur heard.  No friction rub. No gallop.   Pulmonary:     Effort: Pulmonary effort is normal. No respiratory distress.     Breath sounds: Normal breath sounds. No wheezing or rales.  Musculoskeletal:        General: Normal range of motion.     Cervical back: Normal range of motion and neck supple.  Neurological:     General: No focal deficit present.     Mental Status: She is alert and oriented to person, place, and time.     Cranial Nerves: No cranial nerve deficit.  Skin:    General: Skin is warm and dry.     Findings: No erythema.  Psychiatric:        Mood and Affect: Mood normal.        Behavior: Behavior normal.         Judgment: Judgment normal.   Female chaperone present for pelvic and breast  portions of the physical exam  Assessment: 49 y.o. G33P1001 female here for No diagnosis found.   Plan: Problem List Items Addressed This Visit    None       ICD-10-CM   1. Menorrhagia with regular cycle  N92.0   2. Intramural and subserous leiomyoma of uterus  D25.1  D25.2     Long discussion held around options for treatment going forward.  She wants to do something different than her IUD.  Discussed endometrial ablation, uterine fibroid embolization, and hysterectomy. She would like to move forward quickly with hysterectomy.  Reviewed the procedure in detail.  Consents obtained today, in case we can get the surgery schedule for soon.  All questions answered.   A total of 25 minutes were spent face-to-face with the patient as well as preparation, review, communication, and documentation during this encounter.    Prentice Docker, MD 06/25/2020 12:32 PM

## 2020-06-25 NOTE — Telephone Encounter (Signed)
Orders placed. Thanks for making this happen so fast

## 2020-06-25 NOTE — Telephone Encounter (Signed)
-----   Message from Will Bonnet, MD sent at 06/25/2020  1:10 PM EDT ----- Regarding: Schedule surgery Surgery Booking Request Patient Full Name:  Amanda Ibarra  MRN: [id number]  DOB: 1971/06/05  Surgeon: Prentice Docker, MD  Requested Surgery Date and Time: ASAP (8/24, if possible) Primary Diagnosis AND Code:  1) Menorrhagia with regular cycle [N92.0] 2) Intramural and subserous fibroids of uterus [D25.1, D25.2]  Secondary Diagnosis and Code:  Surgical Procedure: Total Laparoscopic hysterectomy, bilateral salpingectomy, cystoscopy.  RNFA Requested?: No L&D Notification: No Admission Status: same day surgery Length of Surgery: 125 min Special Case Needs: No H&P: No Phone Interview???:  Yes Interpreter: No Medical Clearance:  No Special Scheduling Instructions: Yes, in the next week or two Any known health/anesthesia issues, diabetes, sleep apnea, latex allergy, defibrillator/pacemaker?: No Acuity: P3   (P1 highest, P2 delay may cause harm, P3 low, elective gyn, P4 lowest)

## 2020-06-25 NOTE — Telephone Encounter (Signed)
Called patient to schedule TLH/BS, Cystoscopy w Kendra Opitz 8/24  H&P done in clinic today, 8/19  Covid testing 8/20 @ 8-10:30, [address], drive up and wear mask. Advised pt to quarantine until DOS.  Pre-admit phone call appointment to be requested - date and time will be included on H&P paper work. Also all appointments will be updated on pt MyChart. Explained that this appointment has a call window. Based on the time scheduled will indicate if the call will be received within a 4 hour window before 1:00 or after.  Advised that pt may also receive calls from the hospital pharmacy and pre-service center.  Confirmed pt has BCBS as Chartered certified accountant. No secondary insurance.

## 2020-06-26 ENCOUNTER — Other Ambulatory Visit: Payer: Self-pay

## 2020-06-26 ENCOUNTER — Encounter
Admission: RE | Admit: 2020-06-26 | Discharge: 2020-06-26 | Disposition: A | Discharge status: Home / Self Care | Payer: BC Managed Care – PPO | Source: Ambulatory Visit | Attending: Obstetrics and Gynecology | Admitting: Obstetrics and Gynecology

## 2020-06-26 DIAGNOSIS — Z20822 Contact with and (suspected) exposure to covid-19: Secondary | ICD-10-CM | POA: Diagnosis not present

## 2020-06-26 DIAGNOSIS — Z01812 Encounter for preprocedural laboratory examination: Secondary | ICD-10-CM | POA: Diagnosis not present

## 2020-06-26 HISTORY — DX: Personal history of urinary calculi: Z87.442

## 2020-06-26 HISTORY — DX: Gastro-esophageal reflux disease without esophagitis: K21.9

## 2020-06-26 HISTORY — DX: Other complications of anesthesia, initial encounter: T88.59XA

## 2020-06-26 HISTORY — DX: Other specified postprocedural states: Z98.890

## 2020-06-26 HISTORY — DX: Other specified postprocedural states: R11.2

## 2020-06-26 LAB — TYPE AND SCREEN
ABO/RH(D): O POS
Antibody Screen: NEGATIVE

## 2020-06-26 NOTE — Patient Instructions (Signed)
Your procedure is scheduled on: 06-30-20 TUESDAY Report to Same Day Surgery 2nd floor medical mall [facility] Entrance-take elevator on left to 2nd floor.  Check in with surgery information desk.) To find out your arrival time please call [phone] between 1PM - 3PM on 06-29-20 MONDAY  Remember: Instructions that are not followed completely may result in serious medical risk, up to and including death, or upon the discretion of your surgeon and anesthesiologist your surgery may need to be rescheduled.    _x___ 1. Do not eat food after midnight the night before your procedure. NO GUM OR CANDY AFTER MIDNIGHT. You may drink clear liquids up to 2 hours before you are scheduled to arrive at the hospital for your procedure.  Do not drink clear liquids within 2 hours of your scheduled arrival to the hospital.  Clear liquids include  --Water or Apple juice without pulp  -- Gatorade  --Black Coffee or Clear Tea (No milk, no creamers, do not add anything to the coffee or Tea-OK TO ADD SUGAR)     __x__ 2. No Alcohol for 24 hours before or after surgery.   __x__3. No Smoking or e-cigarettes for 24 prior to surgery.  Do not use any chewable tobacco products for at least 6 hour prior to surgery   ____  4. Bring all medications with you on the day of surgery if instructed.    __x__ 5. Notify your doctor if there is any change in your medical condition     (cold, fever, infections).    x___6. On the morning of surgery brush your teeth with toothpaste and water.  You may rinse your mouth with mouth wash if you wish.  Do not swallow any toothpaste or mouthwash.   Do not wear jewelry, make-up, hairpins, clips or nail polish.  Do not wear lotions, powders, or perfumes. You may wear deodorant.  Do not shave 48 hours prior to surgery. Men may shave face and neck.  Do not bring valuables to the hospital.    [facility] is not responsible for any belongings or valuables.               Contacts,  dentures or bridgework may not be worn into surgery.  Leave your suitcase in the car. After surgery it may be brought to your room.  For patients admitted to the hospital, discharge time is determined by your  treatment team.  _  Patients discharged the day of surgery will not be allowed to drive home.  You will need someone to drive you home and stay with you the night of your procedure.    Please read over the following fact sheets that you were given:   [facility] Preparing for Surgery   _x___ TAKE THE FOLLOWING MEDICATION THE MORNING OF SURGERY WITH A SMALL SIP OF WATER. These include:  1. PEPCID (FAMOTIDINE)  2. TAKE AN EXTRA PEPCID THE NIGHT BEFORE SURGERY  3.  4.  5.  6.  ____Fleets enema or Magnesium Citrate as directed.   ____ Use CHG Soap or sage wipes as directed on instruction sheet   ____ Use inhalers on the day of surgery and bring to hospital day of surgery  ____ Stop Metformin and Janumet 2 days prior to surgery.    ____ Take 1/2 of usual insulin dose the night before surgery and none on the morning  surgery.   ____ Follow recommendations from Cardiologist, Pulmonologist or PCP regarding stopping Aspirin, Coumadin, Plavix ,Eliquis, Effient,  or Pradaxa, and Pletal.  X____Stop Anti-inflammatories such as Advil, Aleve, Ibuprofen, Motrin, Naproxen, Naprosyn, Goodies powders or aspirin products NOW- to take Tylenol    _x___ Stop supplements until after surgery-STOP YOUR BLACK PEPPER-TURMERIC NOW-YOU MAY RESUME AFTER YOUR SURGERY   ____ Bring C-Pap to the hospital.

## 2020-06-27 LAB — SARS CORONAVIRUS 2 (TAT 6-24 HRS): SARS Coronavirus 2: NEGATIVE

## 2020-06-30 ENCOUNTER — Ambulatory Visit: Payer: BC Managed Care – PPO | Admitting: Urgent Care

## 2020-06-30 ENCOUNTER — Encounter
Admission: RE | Disposition: A | Discharge status: Home / Self Care | Payer: Self-pay | Source: Home / Self Care | Attending: Obstetrics and Gynecology

## 2020-06-30 ENCOUNTER — Other Ambulatory Visit: Payer: Self-pay

## 2020-06-30 ENCOUNTER — Ambulatory Visit
Admission: RE | Admit: 2020-06-30 | Discharge: 2020-06-30 | Disposition: A | Discharge status: Home / Self Care | Payer: BC Managed Care – PPO | Attending: Obstetrics and Gynecology | Admitting: Obstetrics and Gynecology

## 2020-06-30 ENCOUNTER — Ambulatory Visit: Payer: BC Managed Care – PPO

## 2020-06-30 ENCOUNTER — Encounter: Payer: Self-pay | Admitting: Obstetrics and Gynecology

## 2020-06-30 DIAGNOSIS — Z8349 Family history of other endocrine, nutritional and metabolic diseases: Secondary | ICD-10-CM | POA: Insufficient documentation

## 2020-06-30 DIAGNOSIS — R519 Headache, unspecified: Secondary | ICD-10-CM | POA: Insufficient documentation

## 2020-06-30 DIAGNOSIS — Z8249 Family history of ischemic heart disease and other diseases of the circulatory system: Secondary | ICD-10-CM | POA: Diagnosis not present

## 2020-06-30 DIAGNOSIS — D252 Subserosal leiomyoma of uterus: Secondary | ICD-10-CM | POA: Insufficient documentation

## 2020-06-30 DIAGNOSIS — I10 Essential (primary) hypertension: Secondary | ICD-10-CM | POA: Diagnosis not present

## 2020-06-30 DIAGNOSIS — D259 Leiomyoma of uterus, unspecified: Secondary | ICD-10-CM | POA: Diagnosis not present

## 2020-06-30 DIAGNOSIS — N838 Other noninflammatory disorders of ovary, fallopian tube and broad ligament: Secondary | ICD-10-CM | POA: Diagnosis not present

## 2020-06-30 DIAGNOSIS — M109 Gout, unspecified: Secondary | ICD-10-CM | POA: Diagnosis not present

## 2020-06-30 DIAGNOSIS — F419 Anxiety disorder, unspecified: Secondary | ICD-10-CM | POA: Insufficient documentation

## 2020-06-30 DIAGNOSIS — N840 Polyp of corpus uteri: Secondary | ICD-10-CM | POA: Diagnosis not present

## 2020-06-30 DIAGNOSIS — K219 Gastro-esophageal reflux disease without esophagitis: Secondary | ICD-10-CM | POA: Insufficient documentation

## 2020-06-30 DIAGNOSIS — Z8261 Family history of arthritis: Secondary | ICD-10-CM | POA: Insufficient documentation

## 2020-06-30 DIAGNOSIS — Z90710 Acquired absence of both cervix and uterus: Secondary | ICD-10-CM

## 2020-06-30 DIAGNOSIS — Z803 Family history of malignant neoplasm of breast: Secondary | ICD-10-CM | POA: Diagnosis not present

## 2020-06-30 DIAGNOSIS — N800 Endometriosis of uterus: Secondary | ICD-10-CM | POA: Diagnosis not present

## 2020-06-30 DIAGNOSIS — N920 Excessive and frequent menstruation with regular cycle: Secondary | ICD-10-CM | POA: Diagnosis not present

## 2020-06-30 DIAGNOSIS — N939 Abnormal uterine and vaginal bleeding, unspecified: Secondary | ICD-10-CM | POA: Diagnosis present

## 2020-06-30 DIAGNOSIS — Z808 Family history of malignant neoplasm of other organs or systems: Secondary | ICD-10-CM | POA: Diagnosis not present

## 2020-06-30 DIAGNOSIS — D251 Intramural leiomyoma of uterus: Secondary | ICD-10-CM | POA: Diagnosis not present

## 2020-06-30 DIAGNOSIS — Z87442 Personal history of urinary calculi: Secondary | ICD-10-CM | POA: Insufficient documentation

## 2020-06-30 DIAGNOSIS — E559 Vitamin D deficiency, unspecified: Secondary | ICD-10-CM | POA: Diagnosis not present

## 2020-06-30 HISTORY — PX: TOTAL LAPAROSCOPIC HYSTERECTOMY WITH SALPINGECTOMY: SHX6742

## 2020-06-30 HISTORY — PX: CYSTOSCOPY: SHX5120

## 2020-06-30 LAB — POCT I-STAT, CHEM 8
BUN: 14 mg/dL (ref 6–20)
Calcium, Ion: 1.23 mmol/L (ref 1.15–1.40)
Chloride: 106 mmol/L (ref 98–111)
Creatinine, Ser: 0.8 mg/dL (ref 0.44–1.00)
Glucose, Bld: 111 mg/dL — ABNORMAL HIGH (ref 70–99)
HCT: 42 % (ref 36.0–46.0)
Hemoglobin: 14.3 g/dL (ref 12.0–15.0)
Potassium: 3.9 mmol/L (ref 3.5–5.1)
Sodium: 140 mmol/L (ref 135–145)
TCO2: 23 mmol/L (ref 22–32)

## 2020-06-30 LAB — POCT PREGNANCY, URINE: Preg Test, Ur: NEGATIVE

## 2020-06-30 LAB — ABO/RH: ABO/RH(D): O POS

## 2020-06-30 SURGERY — HYSTERECTOMY, TOTAL, LAPAROSCOPIC, WITH SALPINGECTOMY
Anesthesia: General

## 2020-06-30 MED ORDER — FENTANYL CITRATE (PF) 100 MCG/2ML IJ SOLN
INTRAMUSCULAR | Status: AC
  Filled 2020-06-30: qty 2

## 2020-06-30 MED ORDER — DEXMEDETOMIDINE (PRECEDEX) IN NS 20 MCG/5ML (4 MCG/ML) IV SYRINGE
PREFILLED_SYRINGE | INTRAVENOUS | Status: DC | PRN
  Administered 2020-06-30: 4 ug via INTRAVENOUS
  Administered 2020-06-30 (×3): 8 ug via INTRAVENOUS

## 2020-06-30 MED ORDER — PROPOFOL 10 MG/ML IV BOLUS
INTRAVENOUS | Status: AC
  Filled 2020-06-30: qty 20

## 2020-06-30 MED ORDER — ONDANSETRON 4 MG PO TBDP: 4.0000 mg | ORAL_TABLET | Freq: Four times a day (QID) | ORAL | 0 refills | Status: DC | PRN

## 2020-06-30 MED ORDER — ROCURONIUM BROMIDE 100 MG/10ML IV SOLN
INTRAVENOUS | Status: DC | PRN
  Administered 2020-06-30 (×3): 10 mg via INTRAVENOUS
  Administered 2020-06-30: 20 mg via INTRAVENOUS
  Administered 2020-06-30: 60 mg via INTRAVENOUS

## 2020-06-30 MED ORDER — PROPOFOL 10 MG/ML IV BOLUS
INTRAVENOUS | Status: DC | PRN
  Administered 2020-06-30: 150 mg via INTRAVENOUS

## 2020-06-30 MED ORDER — ACETAMINOPHEN 10 MG/ML IV SOLN
INTRAVENOUS | Status: AC
  Filled 2020-06-30: qty 100

## 2020-06-30 MED ORDER — CHLORHEXIDINE GLUCONATE 0.12 % MT SOLN
OROMUCOSAL | Status: AC
  Administered 2020-06-30: 15 mL via OROMUCOSAL
  Filled 2020-06-30: qty 15

## 2020-06-30 MED ORDER — ONDANSETRON HCL 4 MG/2ML IJ SOLN
4.0000 mg | Freq: Once | INTRAMUSCULAR | Status: AC | PRN
  Administered 2020-06-30: 4 mg via INTRAVENOUS

## 2020-06-30 MED ORDER — OXYCODONE-ACETAMINOPHEN 5-325 MG PO TABS: 1.0000 | ORAL_TABLET | Freq: Four times a day (QID) | ORAL | 0 refills | Status: AC | PRN

## 2020-06-30 MED ORDER — LIDOCAINE HCL (CARDIAC) PF 100 MG/5ML IV SOSY
PREFILLED_SYRINGE | INTRAVENOUS | Status: DC | PRN
  Administered 2020-06-30: 60 mg via INTRAVENOUS

## 2020-06-30 MED ORDER — SUCCINYLCHOLINE CHLORIDE 200 MG/10ML IV SOSY
PREFILLED_SYRINGE | INTRAVENOUS | Status: AC
  Filled 2020-06-30: qty 10

## 2020-06-30 MED ORDER — ONDANSETRON HCL 4 MG/2ML IJ SOLN
INTRAMUSCULAR | Status: AC
  Filled 2020-06-30: qty 2

## 2020-06-30 MED ORDER — KETOROLAC TROMETHAMINE 30 MG/ML IJ SOLN
INTRAMUSCULAR | Status: DC | PRN
  Administered 2020-06-30: 15 mg via INTRAVENOUS

## 2020-06-30 MED ORDER — IBUPROFEN 600 MG PO TABS: 600.0000 mg | ORAL_TABLET | Freq: Four times a day (QID) | ORAL | 0 refills | Status: DC

## 2020-06-30 MED ORDER — CHLORHEXIDINE GLUCONATE 0.12 % MT SOLN: 15.0000 mL | Freq: Once | OROMUCOSAL | Status: AC

## 2020-06-30 MED ORDER — MIDAZOLAM HCL 2 MG/2ML IJ SOLN
INTRAMUSCULAR | Status: AC
  Filled 2020-06-30: qty 2

## 2020-06-30 MED ORDER — DEXMEDETOMIDINE HCL IN NACL 80 MCG/20ML IV SOLN
INTRAVENOUS | Status: AC
  Filled 2020-06-30: qty 20

## 2020-06-30 MED ORDER — LIDOCAINE HCL (PF) 2 % IJ SOLN
INTRAMUSCULAR | Status: AC
  Filled 2020-06-30: qty 5

## 2020-06-30 MED ORDER — POVIDONE-IODINE 10 % EX SWAB
2.0000 "application " | Freq: Once | CUTANEOUS | Status: AC
  Administered 2020-06-30: 2 via TOPICAL

## 2020-06-30 MED ORDER — SUGAMMADEX SODIUM 500 MG/5ML IV SOLN
INTRAVENOUS | Status: DC | PRN
  Administered 2020-06-30: 200 mg via INTRAVENOUS

## 2020-06-30 MED ORDER — ONDANSETRON HCL 4 MG/2ML IJ SOLN
INTRAMUSCULAR | Status: DC | PRN
  Administered 2020-06-30: 4 mg via INTRAVENOUS

## 2020-06-30 MED ORDER — MIDAZOLAM HCL 2 MG/2ML IJ SOLN
INTRAMUSCULAR | Status: DC | PRN
  Administered 2020-06-30: 2 mg via INTRAVENOUS

## 2020-06-30 MED ORDER — FENTANYL CITRATE (PF) 100 MCG/2ML IJ SOLN
25.0000 ug | INTRAMUSCULAR | Status: AC | PRN
  Administered 2020-06-30 (×8): 25 ug via INTRAVENOUS

## 2020-06-30 MED ORDER — ACETAMINOPHEN 10 MG/ML IV SOLN
INTRAVENOUS | Status: DC | PRN
  Administered 2020-06-30: 1000 mg via INTRAVENOUS

## 2020-06-30 MED ORDER — CEFAZOLIN SODIUM-DEXTROSE 2-4 GM/100ML-% IV SOLN
INTRAVENOUS | Status: AC
  Filled 2020-06-30: qty 100

## 2020-06-30 MED ORDER — ORAL CARE MOUTH RINSE: 15.0000 mL | Freq: Once | OROMUCOSAL | Status: AC

## 2020-06-30 MED ORDER — KETOROLAC TROMETHAMINE 30 MG/ML IJ SOLN
INTRAMUSCULAR | Status: AC
  Filled 2020-06-30: qty 1

## 2020-06-30 MED ORDER — ROCURONIUM BROMIDE 10 MG/ML (PF) SYRINGE
PREFILLED_SYRINGE | INTRAVENOUS | Status: AC
  Filled 2020-06-30: qty 10

## 2020-06-30 MED ORDER — LACTATED RINGERS IV SOLN: INTRAVENOUS | Status: DC

## 2020-06-30 MED ORDER — BUPIVACAINE HCL 0.5 % IJ SOLN
INTRAMUSCULAR | Status: DC | PRN
  Administered 2020-06-30: 13 mL

## 2020-06-30 MED ORDER — HEMOSTATIC AGENTS (NO CHARGE) OPTIME
TOPICAL | Status: DC | PRN
  Administered 2020-06-30: 1 via TOPICAL

## 2020-06-30 MED ORDER — DEXAMETHASONE SODIUM PHOSPHATE 10 MG/ML IJ SOLN
INTRAMUSCULAR | Status: AC
  Filled 2020-06-30: qty 1

## 2020-06-30 MED ORDER — OXYCODONE HCL 5 MG PO TABS
5.0000 mg | ORAL_TABLET | Freq: Once | ORAL | Status: AC
  Administered 2020-06-30: 5 mg via ORAL

## 2020-06-30 MED ORDER — CEFAZOLIN SODIUM-DEXTROSE 2-4 GM/100ML-% IV SOLN
2.0000 g | INTRAVENOUS | Status: AC
  Administered 2020-06-30: 2 g via INTRAVENOUS

## 2020-06-30 MED ORDER — OXYCODONE HCL 5 MG PO TABS
ORAL_TABLET | ORAL | Status: AC
  Filled 2020-06-30: qty 1

## 2020-06-30 MED ORDER — FENTANYL CITRATE (PF) 100 MCG/2ML IJ SOLN
INTRAMUSCULAR | Status: DC | PRN
Start: 2020-06-30 — End: 2020-06-30
  Administered 2020-06-30 (×2): 50 ug via INTRAVENOUS

## 2020-06-30 MED ORDER — PHENYLEPHRINE HCL (PRESSORS) 10 MG/ML IV SOLN
INTRAVENOUS | Status: AC
  Filled 2020-06-30: qty 1

## 2020-06-30 MED ORDER — DEXAMETHASONE SODIUM PHOSPHATE 10 MG/ML IJ SOLN
INTRAMUSCULAR | Status: DC | PRN
  Administered 2020-06-30: 10 mg via INTRAVENOUS

## 2020-06-30 SURGICAL SUPPLY — 66 items
ADH SKN CLS APL DERMABOND .7 (GAUZE/BANDAGES/DRESSINGS) ×2
APL PRP STRL LF DISP 70% ISPRP (MISCELLANEOUS) ×2
APL SRG 38 LTWT LNG FL B (MISCELLANEOUS) ×2
APPLICATOR ARISTA FLEXITIP XL (MISCELLANEOUS) ×3 IMPLANT
BAG DRN RND TRDRP ANRFLXCHMBR (UROLOGICAL SUPPLIES) ×2
BAG URINE DRAIN 2000ML AR STRL (UROLOGICAL SUPPLIES) ×3 IMPLANT
BLADE SURG SZ11 CARB STEEL (BLADE) ×3 IMPLANT
CATH FOL 2WAY LX 16X5 (CATHETERS) ×3 IMPLANT
CATH FOLEY 2WAY  5CC 16FR (CATHETERS)
CATH FOLEY 2WAY 5CC 16FR (CATHETERS)
CATH URTH 16FR FL 2W BLN LF (CATHETERS) IMPLANT
CHLORAPREP W/TINT 26 (MISCELLANEOUS) ×3 IMPLANT
COVER LIGHT HANDLE STERIS (MISCELLANEOUS) ×3 IMPLANT
COVER WAND RF STERILE (DRAPES) ×3 IMPLANT
DEFOGGER SCOPE WARMER CLEARIFY (MISCELLANEOUS) ×3 IMPLANT
DERMABOND ADVANCED (GAUZE/BANDAGES/DRESSINGS) ×1
DERMABOND ADVANCED .7 DNX12 (GAUZE/BANDAGES/DRESSINGS) ×2 IMPLANT
DEVICE SUTURE ENDOST 10MM (ENDOMECHANICALS) ×3 IMPLANT
DRAPE 3/4 80X56 (DRAPES) ×3 IMPLANT
DRAPE LEGGINS SURG 28X43 STRL (DRAPES) ×3 IMPLANT
DRAPE UNDER BUTTOCK W/FLU (DRAPES) ×3 IMPLANT
GAUZE 4X4 16PLY RFD (DISPOSABLE) ×3 IMPLANT
GLOVE BIO SURGEON STRL SZ7 (GLOVE) ×18 IMPLANT
GLOVE BIOGEL PI IND STRL 7.5 (GLOVE) ×2 IMPLANT
GLOVE BIOGEL PI INDICATOR 7.5 (GLOVE) ×1
GLOVE INDICATOR 7.5 STRL GRN (GLOVE) ×6 IMPLANT
GOWN STRL REUS W/ TWL LRG LVL3 (GOWN DISPOSABLE) ×12 IMPLANT
GOWN STRL REUS W/ TWL XL LVL3 (GOWN DISPOSABLE) ×2 IMPLANT
GOWN STRL REUS W/TWL LRG LVL3 (GOWN DISPOSABLE) ×18
GOWN STRL REUS W/TWL XL LVL3 (GOWN DISPOSABLE) ×3
GRASPER SUT TROCAR 14GX15 (MISCELLANEOUS) ×3 IMPLANT
HEMOSTAT ARISTA ABSORB 3G PWDR (HEMOSTASIS) ×3 IMPLANT
IRRIGATION STRYKERFLOW (MISCELLANEOUS) ×2 IMPLANT
IRRIGATOR STRYKERFLOW (MISCELLANEOUS) ×3
IV LACTATED RINGERS 1000ML (IV SOLUTION) ×6 IMPLANT
KIT PINK PAD W/HEAD ARE REST (MISCELLANEOUS) ×3
KIT PINK PAD W/HEAD ARM REST (MISCELLANEOUS) ×2 IMPLANT
KIT TURNOVER CYSTO (KITS) ×3 IMPLANT
LABEL OR SOLS (LABEL) ×3 IMPLANT
LIGASURE VESSEL 5MM BLUNT TIP (ELECTROSURGICAL) ×3 IMPLANT
MANIPULATOR VCARE LG CRV RETR (MISCELLANEOUS) IMPLANT
MANIPULATOR VCARE SML CRV RETR (MISCELLANEOUS) ×3 IMPLANT
MANIPULATOR VCARE STD CRV RETR (MISCELLANEOUS) IMPLANT
NEEDLE HYPO 22GX1.5 SAFETY (NEEDLE) ×3 IMPLANT
OCCLUDER COLPOPNEUMO (BALLOONS) ×3 IMPLANT
PACK LAP CHOLECYSTECTOMY (MISCELLANEOUS) ×3 IMPLANT
PAD OB MATERNITY 4.3X12.25 (PERSONAL CARE ITEMS) ×3 IMPLANT
PAD PREP 24X41 OB/GYN DISP (PERSONAL CARE ITEMS) ×3 IMPLANT
SCISSORS METZENBAUM CVD 33 (INSTRUMENTS) ×3 IMPLANT
SET CYSTO W/LG BORE CLAMP LF (SET/KITS/TRAYS/PACK) ×3 IMPLANT
SET TRI-LUMEN FLTR TB AIRSEAL (TUBING) ×3 IMPLANT
SLEEVE ENDOPATH XCEL 5M (ENDOMECHANICALS) ×6 IMPLANT
SOL ANTI-FOG 6CC FOG-OUT (MISCELLANEOUS) ×2 IMPLANT
SOL FOG-OUT ANTI-FOG 6CC (MISCELLANEOUS) ×1
SOL PREP PVP 2OZ (MISCELLANEOUS) ×3
SOLUTION PREP PVP 2OZ (MISCELLANEOUS) ×2 IMPLANT
SURGILUBE 2OZ TUBE FLIPTOP (MISCELLANEOUS) ×3 IMPLANT
SUT ENDO VLOC 180-0-8IN (SUTURE) ×3 IMPLANT
SUT MNCRL 4-0 (SUTURE) ×3
SUT MNCRL 4-0 27XMFL (SUTURE) ×2
SUT VIC AB 0 CT1 36 (SUTURE) ×3 IMPLANT
SUTURE MNCRL 4-0 27XMF (SUTURE) ×2 IMPLANT
SYR 10ML LL (SYRINGE) ×6 IMPLANT
SYR 50ML LL SCALE MARK (SYRINGE) ×3 IMPLANT
TROCAR PORT AIRSEAL 8X100 (TROCAR) ×3 IMPLANT
TROCAR XCEL NON-BLD 5MMX100MML (ENDOMECHANICALS) ×3 IMPLANT

## 2020-06-30 NOTE — Discharge Instructions (Signed)

## 2020-06-30 NOTE — OR Nursing (Signed)
Dr Andree Elk reviewed am EKG and aware of am Vital signs this am.

## 2020-06-30 NOTE — Transfer of Care (Signed)
Immediate Anesthesia Transfer of Care Note  Patient: Amanda Ibarra  Procedure(s) Performed: TOTAL LAPAROSCOPIC HYSTERECTOMY WITH BILATERAL SALPINGECTOMY (Bilateral ) CYSTOSCOPY (N/A )  Patient Location: PACU  Anesthesia Type:General  Level of Consciousness: awake  Airway & Oxygen Therapy: Patient connected to face mask oxygen  Post-op Assessment: Post -op Vital signs reviewed and stable  Post vital signs: stable  Last Vitals:  Vitals Value Taken Time  BP 148/87   Temp    Pulse 109 06/30/20 1221  Resp 17   SpO2 98 % 06/30/20 1221  Vitals shown include unvalidated device data.  Last Pain:  Vitals:   06/30/20 0923  TempSrc: Temporal  PainSc:          Complications: No complications documented.

## 2020-06-30 NOTE — Op Note (Signed)
Operative Note    Name: Amanda Ibarra  Date of Service: 06/30/2020  DOB: 1971/09/16  MRN: [id number]   Pre-Operative Diagnosis:  1) Menorrhagia with regular cycle [N92.0] 2) Intramural and subserous leiomyoma of the uterus [D25.1, D25.2]  Post-Operative Diagnosis:  1) Menorrhagia with regular cycle [N92.0] 2) Intramural and subserous leiomyoma of the uterus [D25.1, D25.2]  Procedures:  1. Total laparoscopic hysterectomy, bilateral salpingectomy 2. Cystoscopy  Primary Surgeon: Prentice Docker, MD   Assistant Surgeon: Barnett Applebaum, MD - No other capable assistant available, in surgery requiring high level assistant.  EBL: 100 mL   IVF: 800 mL crystalloid  Urine output: 400 mL clear urine at end of procedure  Specimens: Uterus with cervix and bilateral fallopian tubes (Mirena intrauterine device within uterus)  Drains: None  Complications: None   Disposition: PACU   Condition: Stable   Findings:  1) Globally mildly enlarged uterus with at least one pedunculated fibroids, normal appearing bilateral fallopian tubes, and ovaries. 2) Mild adhesions of the colon to the left pelvis above the pelvic rim. 3) On cystoscopy, no apparent damage to bladder wall with visualized efflux of urine from both ureteral orifices.  Procedure Summary:  The patient was taken to the operating room where general anesthesia was administered and found to be adequate. She was placed in the dorsal supine lithotomy position in Yarnell stirrups and prepped and draped in usual sterile fashion. After a timeout was called, an indwelling catheter was placed in her bladder. A sterile speculum was placed in the vagina and a single-tooth tenaculum was used to grasp the anterior lip of the cervix. A V-Care uterine manipulator was affixed to the uterus in accordance to the manufacturers recommendations. The speculum and tenaculum were removed from the vagina.  Attention was turned to the abdomen where, after  injection of local anesthetic, a 5 mm infraumbilical incision was made with the scalpel. Entry into the abdomen was obtained via Optiview trocar technique (a blunt entry technique with camera visualization through the obturator upon entry). Verification of entry into the abdomen was obtained using opening pressures. The abdomen was insufflated with CO2. The camera was introduced through the trocar with verification of atraumatic entry. Left and right lower quadrant 5 mm ports were created via direct intra-abdominal camera visualization without difficulty. An 8 mm AirSeal suprapubic port was placed in a similar fashion without difficulty.  After inspection of the abdomen and pelvis with the above-noted findings, the bilateral ureters were identified and found to be well away from the operative area of interest. The left fallopian tube was grasped at the fimbriated end and was transected using the LigaSure along the mesosalpinx in a lateral to medial fashion. The LigaSure then was used to transect the left round ligament and the utero-ovarian ligament was transected. Tissue was divided along the left broad ligament to the level of the interior cervical os. The lower uterine segment was identified. Bladder tissue was dissected off the lower uterine segment and cervix without difficulty. The left uterine artery was skeletonized and identified and after ligation was transected with the LigaSure device. The same procedure was carried out on the right side. The colpotomy was performed using monopolar electrocautery in a circumferential fashion following the KOH ring.  The uterus and fallopian tubes and cervix were removed through the vagina.  Closure of the vaginal cuff was undertaken using the V-lock stitch in a running fashion. All vascular pedicles were inspected and found to be hemostatic. Copious irrigation was undertaken and hemostasis  was again verified. Arista was placed along the vaginal cuff to ensure ongoing  hemostasis.  Pressure was dropped in the abdomen to 5 mmHg to verify ongoing hemostasis.  The suprapubic trocar was removed and the fascia was reapproximated using #0 Vicryl with a single stitch. The abdomen was then desufflated of CO2 after removal of all instruments. The suprapubic skin incision was closed using 4-0 Vicryl in a subcuticular fashion. The remaining skin incisions were closed using a single subcutaneous stitch to reduce tension on the skin closure.  Surgical skin glue was used to re-approximate all incision sites.   Cystoscopy was undertaken at this point. The Foley catheter was removed and the 70 cystoscope was gently introduced through the urethra. The bladder survey was undertaken with efflux of urine from both orifices noted. There were no defects noted in the bladder wall. The cystoscope was removed and the Foley catheter was replaced to drain the bladder, then the Foley catheter was removed.  A digital vaginal sweep was undertaken to ensure there were no remaining sponges or instruments.  The assistant created two of the port sites, performed tissue retraction, camera management, dissection along the right adnexa and vascular pedicles.   The patient tolerated the procedure well.  Sponge, lap, needle, and instrument counts were correct x 2.  VTE prophylaxis: SCDs. Antibiotic prophylaxis: Ancef 2 grams IV given prior to skin incision. She was awakened in the operating room and was taken to the PACU in stable condition. The assistant surgeon was an MD due to lack of availability of another Counselling psychologist.   Prentice Docker, MD 06/30/2020 12:16 PM

## 2020-06-30 NOTE — OR Nursing (Signed)
Clarified antibiotic order with Dr Glennon Mac and aware of am vital signs no new orders

## 2020-06-30 NOTE — Anesthesia Procedure Notes (Addendum)
Procedure Name: Intubation Date/Time: 06/30/2020 10:07 AM Performed by: Aline Brochure, CRNA Pre-anesthesia Checklist: Patient identified, Patient being monitored, Timeout performed, Emergency Drugs available and Suction available Patient Re-evaluated:Patient Re-evaluated prior to induction Oxygen Delivery Method: Circle system utilized Preoxygenation: Pre-oxygenation with 100% oxygen Induction Type: IV induction Ventilation: Two handed mask ventilation required and Oral airway inserted - appropriate to patient size Laryngoscope Size: Mac and 3 Grade View: Grade II Tube type: Oral Tube size: 7.0 mm Number of attempts: 1 Airway Equipment and Method: Stylet Placement Confirmation: ETT inserted through vocal cords under direct vision,  positive ETCO2 and breath sounds checked- equal and bilateral Secured at: 23 cm Tube secured with: Tape Dental Injury: Teeth and Oropharynx as per pre-operative assessment  Comments: Intubated by Levert Feinstein, SRNA.

## 2020-06-30 NOTE — Anesthesia Preprocedure Evaluation (Signed)
Anesthesia Evaluation  Patient identified by MRN, date of birth, ID band Patient awake    Reviewed: Allergy & Precautions, H&P , NPO status , Patient's Chart, lab work & pertinent test results, reviewed documented beta blocker date and time   History of Anesthesia Complications (+) PONV and history of anesthetic complications  Airway Mallampati: II  TM Distance: >3 FB Neck ROM: full    Dental  (+) Teeth Intact   Pulmonary neg pulmonary ROS,    Pulmonary exam normal        Cardiovascular Exercise Tolerance: Good hypertension, On Medications negative cardio ROS Normal cardiovascular exam Rhythm:regular Rate:Normal     Neuro/Psych  Headaches, PSYCHIATRIC DISORDERS Anxiety    GI/Hepatic Neg liver ROS, GERD  Medicated,  Endo/Other  negative endocrine ROS  Renal/GU Renal disease  negative genitourinary   Musculoskeletal   Abdominal   Peds  Hematology negative hematology ROS (+)   Anesthesia Other Findings Past Medical History: No date: CIN I (cervical intraepithelial neoplasia I) No date: Complication of anesthesia 11/30/2019: Cyst of right ovary No date: GERD (gastroesophageal reflux disease)     Comment:  OCC  No date: Gout No date: History of kidney stones     Comment:  H/O No date: Hypertension No date: Menorrhagia No date: PONV (postoperative nausea and vomiting)     Comment:  NAUSEA AFTER D/C X1 No date: Vitamin D deficiency Past Surgical History: 12/12/1995: CERVICAL BIOPSY  W/ LOOP ELECTRODE EXCISION 10/09/1996: COLPOSCOPY 11/2011: DILATION AND CURETTAGE OF UTERUS     Comment:  for menorrhagia No date: IUD MIrena No date: TONSILLECTOMY No date: Wisdon teeth   Reproductive/Obstetrics negative OB ROS                             Anesthesia Physical Anesthesia Plan  ASA: II  Anesthesia Plan: General ETT   Post-op Pain Management:    Induction:   PONV Risk Score and  Plan:   Airway Management Planned:   Additional Equipment:   Intra-op Plan:   Post-operative Plan:   Informed Consent: I have reviewed the patients History and Physical, chart, labs and discussed the procedure including the risks, benefits and alternatives for the proposed anesthesia with the patient or authorized representative who has indicated his/her understanding and acceptance.     Dental Advisory Given  Plan Discussed with: CRNA  Anesthesia Plan Comments:         Anesthesia Quick Evaluation

## 2020-06-30 NOTE — Interval H&P Note (Signed)
History and Physical Interval Note:  06/30/2020 9:43 AM  Amanda Ibarra  has presented today for surgery, with the diagnosis of Menorrhagia with regular cycle N92.0 Intramural and subserous fibroids of uterus D25.1, D25.2.  The various methods of treatment have been discussed with the patient and family. After consideration of risks, benefits and other options for treatment, the patient has consented to  Procedure(s): TOTAL LAPAROSCOPIC HYSTERECTOMY WITH BILATERAL SALPINGECTOMY (Bilateral) CYSTOSCOPY (N/A) as a surgical intervention.  The patient's history has been reviewed, patient examined, no change in status, stable for surgery.  I have reviewed the patient's chart and labs.  Questions were answered to the patient's satisfaction. Consents reviewed.  Patient agrees to proceed.    Prentice Docker, MD, [facility], [facility] 06/30/2020 9:44 AM

## 2020-07-01 ENCOUNTER — Encounter: Payer: Self-pay | Admitting: Obstetrics and Gynecology

## 2020-07-01 ENCOUNTER — Telehealth: Payer: Self-pay | Admitting: Obstetrics and Gynecology

## 2020-07-01 ENCOUNTER — Telehealth: Payer: Self-pay

## 2020-07-01 NOTE — Telephone Encounter (Signed)
FMLA/DISABILITY form for [employer] filled out, signature obtained and given to [facility] for processing.

## 2020-07-01 NOTE — Telephone Encounter (Signed)
Spoke with patient and her husband.  She is doing well this morning.  She is ambulating, voiding, tolerating p.o., her pain is well controlled on p.o. medications.  She has had no vaginal bleeding.  She has no current complaints.  All questions answered.  Encouraged to follow-up in 1 week for incision check.  Encouraged to call if any concerns occur in the meantime.

## 2020-07-02 NOTE — Anesthesia Postprocedure Evaluation (Signed)
Anesthesia Post Note  Patient: Amanda Ibarra  Procedure(s) Performed: TOTAL LAPAROSCOPIC HYSTERECTOMY WITH BILATERAL SALPINGECTOMY (Bilateral ) CYSTOSCOPY (N/A )  Patient location during evaluation: PACU Anesthesia Type: General Level of consciousness: awake and alert Pain management: pain level controlled Vital Signs Assessment: post-procedure vital signs reviewed and stable Respiratory status: spontaneous breathing, nonlabored ventilation, respiratory function stable and patient connected to nasal cannula oxygen Cardiovascular status: blood pressure returned to baseline and stable Postop Assessment: no apparent nausea or vomiting Anesthetic complications: no   No complications documented.   Last Vitals:  Vitals:   06/30/20 1347 06/30/20 1452  BP: 117/73 124/74  Pulse: 74 85  Resp: 16 16  Temp: 36.8 C 36.8 C  SpO2: 98% 98%    Last Pain:  Vitals:   07/01/20 0827  TempSrc:   PainSc: [place]

## 2020-07-03 LAB — SURGICAL PATHOLOGY

## 2020-07-10 ENCOUNTER — Other Ambulatory Visit: Payer: Self-pay

## 2020-07-10 ENCOUNTER — Ambulatory Visit (INDEPENDENT_AMBULATORY_CARE_PROVIDER_SITE_OTHER): Payer: BC Managed Care – PPO | Admitting: Obstetrics and Gynecology

## 2020-07-10 ENCOUNTER — Encounter: Payer: Self-pay | Admitting: Obstetrics and Gynecology

## 2020-07-10 VITALS — BP 124/74 | Ht 68.0 in | Wt 235.0 lb

## 2020-07-10 DIAGNOSIS — Z09 Encounter for follow-up examination after completed treatment for conditions other than malignant neoplasm: Secondary | ICD-10-CM

## 2020-07-10 NOTE — Progress Notes (Signed)
   Postoperative Follow-up Patient presents post op from TLH/BS/Cysto 10 days ago for abnormal uterine bleeding.  Subjective: Patient reports marked improvement in her preop symptoms. Eating a regular diet without difficulty. Pain is controlled with current analgesics. Medications being used: acetaminophen and ibuprofen (OTC).  Activity: increasing slowly.  Objective: Vitals:   07/10/20 1039  BP: 124/74   Vital Signs: BP 124/74   Ht 5\' 8"  (1.727 m)   Wt 235 lb (106.6 kg)   BMI 35.73 kg/m  Constitutional: Well nourished, well developed female in no acute distress.  HEENT: normal Skin: Warm and dry.  Extremity: no edema  Abdomen: Soft, non-tender, normal bowel sounds; no bruits, organomegaly or masses. clean, dry and intact   Assessment: 49 y.o. s/p above surgery progressing well  Plan: Patient has done well after surgery with no apparent complications.  I have discussed the post-operative course to date, and the expected progress moving forward.  The patient understands what complications to be concerned about.  I will see the patient in routine follow up, or sooner if needed.    Activity plan: increase slowly, no intercourse x 8 weeks  Return in about 5 weeks (around 08/14/2020) for Six week post op visit.   Prentice Docker, MD 07/10/2020, 10:52 AM

## 2020-07-14 ENCOUNTER — Other Ambulatory Visit: Payer: Self-pay | Admitting: Physician Assistant

## 2020-07-14 ENCOUNTER — Telehealth: Payer: Self-pay

## 2020-07-14 NOTE — Telephone Encounter (Signed)
Pt calling to speak c BS; has talked to company nurse; what fax # was FMLA faxed to?  The company nurse hasn't received it.  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  [phone]

## 2020-07-15 ENCOUNTER — Other Ambulatory Visit: Payer: Self-pay | Admitting: Physician Assistant

## 2020-07-15 NOTE — Telephone Encounter (Signed)
Noted that pt. Has established care with new PCP; Dr. Baldemar Lenis at [facility] / [place].  Attempted to call pt. to verbally verify this.  Left vm. To inform pharmacy if she has changed her PCP, and to call [facility] if she plans to continue care with this office.  Will refuse Rx refill at this time.

## 2020-08-11 ENCOUNTER — Encounter: Payer: Self-pay | Admitting: Obstetrics and Gynecology

## 2020-08-11 ENCOUNTER — Other Ambulatory Visit: Payer: Self-pay

## 2020-08-11 ENCOUNTER — Ambulatory Visit (INDEPENDENT_AMBULATORY_CARE_PROVIDER_SITE_OTHER): Payer: BC Managed Care – PPO | Admitting: Obstetrics and Gynecology

## 2020-08-11 VITALS — BP 126/84 | Ht 68.0 in | Wt 238.0 lb

## 2020-08-11 DIAGNOSIS — Z09 Encounter for follow-up examination after completed treatment for conditions other than malignant neoplasm: Secondary | ICD-10-CM

## 2020-08-11 NOTE — Progress Notes (Signed)
   Postoperative Follow-up Patient presents post op from Total Laparoscopic Hysterectomy, bilateral salpingectomy, cystoscopy 6 weeks ago for abnormal uterine bleeding.  Subjective: Patient reports marked improvement in her preop symptoms. Eating a regular diet without difficulty. The patient is not having any pain.  Activity: normal activities of daily living.  Objective: Vitals:   08/11/20 0851  BP: 126/84   Vital Signs: BP 126/84   Ht 5\' 8"  (1.727 m)   Wt 238 lb (108 kg)   BMI 36.19 kg/m  Constitutional: Well nourished, well developed female in no acute distress.  HEENT: normal Skin: Warm and dry.  Extremity: no edema  Abdomen: Soft, non-tender, normal bowel sounds; no bruits, organomegaly or masses. clean, dry and intact at all incision sites  Pelvic exam: (female chaperone present) is not limited by body habitus EGBUS: within normal limits Vagina: within normal limits and with normal mucosa blood in the vault  Vaginal cuff clean, dry, intact, without erythema, induration, warmth, and tenderness    Assessment: 49 y.o. s/p above surgery progressing well  Plan: Patient has done well after surgery with no apparent complications.  I have discussed the post-operative course to date, and the expected progress moving forward.  The patient understands what complications to be concerned about.  I will see the patient in routine follow up, or sooner if needed.    Activity plan: No restriction.  Prentice Docker, MD 08/11/2020, 9:00 AM

## 2021-03-16 ENCOUNTER — Telehealth: Payer: Self-pay

## 2021-03-16 NOTE — Telephone Encounter (Signed)
Patient is scheduled for Aug 9 with [facility]

## 2021-03-16 NOTE — Telephone Encounter (Signed)
Looks like Glennon Mac has seen this patient and done a hysterectomy for her. Maybe he would be comfortable ordering her a mammogram. Generally they need to be seen for an annual for a breast exam to place this order.  Thank you,  Dr. Gilman Schmidt

## 2021-03-16 NOTE — Telephone Encounter (Signed)
Pt calling; last mammogram was 04/21/20; needs referral for annual mammogram; has not seen [facility] yet; former [place] [facility] pt.  [phone]

## 2021-06-15 ENCOUNTER — Ambulatory Visit: Payer: BC Managed Care – PPO | Admitting: Obstetrics and Gynecology

## 2021-06-21 ENCOUNTER — Other Ambulatory Visit: Payer: Self-pay

## 2021-06-21 ENCOUNTER — Encounter: Payer: Self-pay | Admitting: Obstetrics and Gynecology

## 2021-06-21 ENCOUNTER — Ambulatory Visit (INDEPENDENT_AMBULATORY_CARE_PROVIDER_SITE_OTHER): Payer: BC Managed Care – PPO | Admitting: Obstetrics and Gynecology

## 2021-06-21 VITALS — BP 136/72 | Ht 68.0 in | Wt 241.4 lb

## 2021-06-21 DIAGNOSIS — Z0000 Encounter for general adult medical examination without abnormal findings: Secondary | ICD-10-CM | POA: Diagnosis not present

## 2021-06-21 DIAGNOSIS — Z1239 Encounter for other screening for malignant neoplasm of breast: Secondary | ICD-10-CM | POA: Diagnosis not present

## 2021-06-21 DIAGNOSIS — Z1231 Encounter for screening mammogram for malignant neoplasm of breast: Secondary | ICD-10-CM

## 2021-06-21 DIAGNOSIS — Z01419 Encounter for gynecological examination (general) (routine) without abnormal findings: Secondary | ICD-10-CM | POA: Diagnosis not present

## 2021-06-21 NOTE — Progress Notes (Signed)
Gynecology Annual Exam  PCP: Derinda Late, MD  Chief Complaint:  Chief Complaint  Patient presents with   Gynecologic Exam    History of Present Illness: Patient is a 50 y.o. G1P1001 presents for annual exam. The patient has no complaints today.   LMP: Patient's last menstrual period was 06/07/2020 (exact date). She denies vagina lbleeding or spotting  The patient is sexually active. She denies dyspareunia.  Postcoital Bleeding: no   The patient does perform self breast exams.  There is notable family history of breast or ovarian cancer in her family.  The patient has regular exercise: walk/stationary bike 2-3 times a week  The patient denies current symptoms of depression.   PHQ-9: 0 GAD-7: 0   Review of Systems: Review of Systems  Constitutional:  Negative for chills, fever, malaise/fatigue and weight loss.  HENT:  Negative for congestion, hearing loss and sinus pain.   Eyes:  Negative for blurred vision and double vision.  Respiratory:  Negative for cough, sputum production, shortness of breath and wheezing.   Cardiovascular:  Negative for chest pain, palpitations, orthopnea and leg swelling.  Gastrointestinal:  Negative for abdominal pain, constipation, diarrhea, nausea and vomiting.  Genitourinary:  Negative for dysuria, flank pain, frequency, hematuria and urgency.  Musculoskeletal:  Negative for back pain, falls and joint pain.  Skin:  Negative for itching and rash.  Neurological:  Negative for dizziness and headaches.  Psychiatric/Behavioral:  Negative for depression, substance abuse and suicidal ideas. The patient is not nervous/anxious.    Past Medical History:  Past Medical History:  Diagnosis Date   CIN I (cervical intraepithelial neoplasia I)    Complication of anesthesia    Cyst of right ovary 11/30/2019   GERD (gastroesophageal reflux disease)    OCC    Gout    History of kidney stones    H/O   Hypertension    Menorrhagia    PONV  (postoperative nausea and vomiting)    NAUSEA AFTER D/C X1   Vitamin D deficiency     Past Surgical History:  Past Surgical History:  Procedure Laterality Date   CERVICAL BIOPSY  W/ LOOP ELECTRODE EXCISION  12/12/1995   COLPOSCOPY  10/09/1996   CYSTOSCOPY N/A 06/30/2020   Procedure: CYSTOSCOPY;  Surgeon: Will Bonnet, MD;  Location: [facility] ORS;  Service: Gynecology;  Laterality: N/A;   DILATION AND CURETTAGE OF UTERUS  11/2011   for menorrhagia   IUD MIrena     TONSILLECTOMY     TOTAL LAPAROSCOPIC HYSTERECTOMY WITH SALPINGECTOMY Bilateral 06/30/2020   Procedure: TOTAL LAPAROSCOPIC HYSTERECTOMY WITH BILATERAL SALPINGECTOMY;  Surgeon: Will Bonnet, MD;  Location: [facility] ORS;  Service: Gynecology;  Laterality: Bilateral;   Wisdon teeth      Gynecologic History:  Patient's last menstrual period was 06/07/2020 (exact date). Last Pap: Results were: 2020 NIL   Last mammogram: BIR[facility] 1 Results were: BI-RAD I  Obstetric History: G1P1001  Family History:  Family History  Problem Relation Age of Onset   Hypertension Father    Arthritis/Rheumatoid Mother    Hyperlipidemia Mother    Hypertension Mother    Thyroid disease Mother    Breast cancer Mother 63   Bone cancer Maternal Grandmother    Heart attack Maternal Grandfather    Gout Maternal Grandfather     Social History:  Social History   Socioeconomic History   Marital status: Married    Spouse name: Not on file   Number of children: 1   Years  of education: Bachelors   Highest education level: Not on file  Occupational History    Employer: [employer]    Comment: Full Time  Tobacco Use   Smoking status: Never   Smokeless tobacco: Never  Vaping Use   Vaping Use: Never used  Substance and Sexual Activity   Alcohol use: Yes    Comment: very rarely   Drug use: No   Sexual activity: Yes    Partners: Male    Birth control/protection: I.U.D.  Other Topics Concern   Not on file  Social History Narrative    Not on file   Social Determinants of Health   Financial Resource Strain: Not on file  Food Insecurity: Not on file  Transportation Needs: Not on file  Physical Activity: Not on file  Stress: Not on file  Social Connections: Not on file  Intimate Partner Violence: Not on file    Allergies:  Allergies  Allergen Reactions   Azithromycin Nausea And Vomiting   Codeine Other (See Comments)    Unknown, occurred has an infant but has taken Hydrocodone since.   Penicillins Rash    Medications: Prior to Admission medications   Medication Sig Start Date End Date Taking? Authorizing Provider  amLODipine (NORVASC) 10 MG tablet TAKE ONE TABLET EVERY DAY Patient taking differently: Take 10 mg by mouth at bedtime. 01/09/20  Yes Fenton Malling M, PA-C  Black Pepper-Turmeric (TURMERIC CURCUMIN) 03-999 MG CAPS Take 1 tablet by mouth daily.    Yes [provider]  Cholecalciferol (VITAMIN D) 2000 UNITS tablet Take 2,000 Units by mouth daily.   Yes [provider]  colchicine 0.6 MG tablet Take 0.6 mg by mouth daily as needed (gout).  05/12/20  Yes [provider]  famotidine (PEPCID) 10 MG tablet Take 10 mg by mouth as needed for heartburn or indigestion.   Yes [provider]  MULTIPLE VITAMIN PO Take 1 tablet by mouth daily.   Yes [provider]  omeprazole (PRILOSEC OTC) 20 MG tablet Take 20 mg by mouth as needed.   Yes [provider]  ibuprofen (ADVIL) 600 MG tablet Take 1 tablet (600 mg total) by mouth every 6 (six) hours. 06/30/20   Will Bonnet, MD  lisinopril-hydrochlorothiazide (ZESTORETIC) 20-12.5 MG tablet Take 1 tablet by mouth every morning.  04/30/19 06/26/20  [provider]  ondansetron (ZOFRAN ODT) 4 MG disintegrating tablet Take 1 tablet (4 mg total) by mouth every 6 (six) hours as needed for nausea. 06/30/20   Will Bonnet, MD    Physical Exam Vitals: Blood pressure 136/72, height '5\' 8"'$  (1.727 m), weight  241 lb 6.4 oz (109.5 kg), last menstrual period 06/07/2020.  Physical Exam Constitutional:      Appearance: She is well-developed.  Genitourinary:     Genitourinary Comments: External: Normal appearing vulva. No lesions noted. Small bilateral petechiae on labia majora, no other skin changes  Bimanual examination: Uterus absent. No adnexal masses. No adnexal tenderness. Pelvis not fixed.  Breast Exam: breast equal without skin changes, nipple discharge, breast lump or enlarged lymph nodes   HENT:     Head: Normocephalic and atraumatic.  Neck:     Thyroid: No thyromegaly.  Cardiovascular:     Rate and Rhythm: Normal rate and regular rhythm.     Heart sounds: Normal heart sounds.  Pulmonary:     Effort: Pulmonary effort is normal.     Breath sounds: Normal breath sounds.  Abdominal:     General: Bowel  sounds are normal. There is no distension.     Palpations: Abdomen is soft. There is no mass.  Musculoskeletal:     Cervical back: Neck supple.  Neurological:     Mental Status: She is alert and oriented to person, place, and time.  Skin:    General: Skin is warm and dry.  Psychiatric:        Behavior: Behavior normal.        Thought Content: Thought content normal.        Judgment: Judgment normal.  Vitals reviewed.     Female chaperone present for pelvic and breast  portions of the physical exam  Assessment: 50 y.o. G1P1001 routine annual exam  Plan: Problem List Items Addressed This Visit   None Visit Diagnoses     Encounter for annual routine gynecological examination    -  Primary   Health maintenance examination       Breast cancer screening by mammogram       Relevant Orders   MM 3D SCREEN BREAST BILATERAL   Encounter for screening breast examination       Encounter for gynecological examination without abnormal finding           1) Mammogram - recommend yearly screening mammogram.  Mammogram was ordered today.  2) STI screening was offered and  declined  3) ASCCP guidelines and rational discussed.  Patient opts to discontinue screening after hysterectomy. Denies CIN 2 or greater lesions in last 20 years.  4) Colonoscopy -- discussed screening, she plans to discuss further with her PCP.   5) Routine healthcare maintenance including cholesterol, diabetes screening discussed managed by PCP  6) Osteoporosis screening - no increased risk factors, start at 65.   Adrian Prows MD, [facility], [facility] 06/21/2021 10:11 AM

## 2021-06-21 NOTE — Patient Instructions (Signed)
[employer] Recommended Dietary Allowances for Calcium and Vitamin D  Age (yr) Calcium Recommended Dietary Allowance (mg/day) Vitamin D Recommended Dietary Allowance (international units/day)  9-18 1,300 600  19-50 1,000 600  51-70 1,200 600  71 and older 1,200 800  Data from [employer]. Dietary reference intakes: calcium, vitamin D. [place], [place]: National [employer]ies [employer]; 2011.   Exercising to Stay Healthy To become healthy and stay healthy, it is recommended that you do moderate-intensity and vigorous-intensity exercise. You can tell that you are exercising at a moderate intensity if your heart starts beating faster and you start breathing faster but can still hold a conversation. You can tell that you are exercising at a vigorous intensity if you are breathing much harder andfaster and cannot hold a conversation while exercising. Exercising regularly is important. It has many health benefits, such as: Improving overall fitness, flexibility, and endurance. Increasing bone density. Helping with weight control. Decreasing body fat. Increasing muscle strength. Reducing stress and tension. Improving overall health. How often should I exercise? Choose an activity that you enjoy, and set realistic goals. Your health careprovider can help you make an activity plan that works for you. Exercise regularly as told by your health care provider. This may include: Doing strength training two times a week, such as: Lifting weights. Using resistance bands. Push-ups. Sit-ups. Yoga. Doing a certain intensity of exercise for a given amount of time. Choose from these options: A total of 150 minutes of moderate-intensity exercise every week. A total of 75 minutes of vigorous-intensity exercise every week. A mix of moderate-intensity and vigorous-intensity exercise every week. Children, pregnant women, people who have not exercised regularly, people who are overweight, and  older adults may need to talk with a health care provider about what activities are safe to do. If you have a medical condition, be sureto talk with your health care provider before you start a new exercise program. What are some exercise ideas? Moderate-intensity exercise ideas include: Walking 1 mile (1.6 km) in about 15 minutes. Biking. Hiking. Golfing. Dancing. Water aerobics. Vigorous-intensity exercise ideas include: Walking 4.5 miles (7.2 km) or more in about 1 hour. Jogging or running 5 miles (8 km) in about 1 hour. Biking 10 miles (16.1 km) or more in about 1 hour. Lap swimming. Roller-skating or in-line skating. Cross-country skiing. Vigorous competitive sports, such as football, basketball, and soccer. Jumping rope. Aerobic dancing. What are some everyday activities that can help me to get exercise? Yard work, such as: Pushing a lawn mower. Raking and bagging leaves. Washing your car. Pushing a stroller. Shoveling snow. Gardening. Washing windows or floors. How can I be more active in my day-to-day activities? Use stairs instead of an elevator. Take a walk during your lunch break. If you drive, park your car farther away from your work or school. If you take public transportation, get off one stop early and walk the rest of the way. Stand up or walk around during all of your indoor phone calls. Get up, stretch, and walk around every 30 minutes throughout the day. Enjoy exercise with a friend. Support to continue exercising will help you keep a regular routine of activity. What guidelines can I follow while exercising? Before you start a new exercise program, talk with your health care provider. Do not exercise so much that you hurt yourself, feel dizzy, or get very short of breath. Wear comfortable clothes and wear shoes with good support. Drink plenty of water while you exercise to prevent   dehydration or heat stroke. Work out until your breathing and your  heartbeat get faster. Where to find more information [place] Department of Health and Human Services: www.hhs.gov Centers for Disease Control and Prevention (CDC): www.cdc.gov Summary Exercising regularly is important. It will improve your overall fitness, flexibility, and endurance. Regular exercise also will improve your overall health. It can help you control your weight, reduce stress, and improve your bone density. Do not exercise so much that you hurt yourself, feel dizzy, or get very short of breath. Before you start a new exercise program, talk with your health care provider. This information is not intended to replace advice given to you by your health care provider. Make sure you discuss any questions you have with your healthcare provider. Document Revised: 10/09/2020 Document Reviewed: 10/09/2020 Elsevier Patient Education  2022 [employer]. Budget-Friendly Healthy Eating There are many ways to save money at the grocery store and continue to eat healthy. You can be successful if you: Plan meals according to your budget. Make a grocery list and only purchase food according to your grocery list. Prepare food yourself at home. What are tips for following this plan? Reading food labels Compare food labels between brand name foods and the store brand. Often the nutritional value is the same, but the store brand is lower cost. Look for products that do not have added sugar, fat, or salt (sodium). These often cost the same but are healthier for you. Products may be labeled as: Sugar-free. Nonfat. Low-fat. Sodium-free. Low-sodium. Look for lean ground beef labeled as at least 92% lean and 8% fat. Shopping  Buy only the items on your grocery list and go only to the areas of the store that have the items on your list. Use coupons only for foods and brands you normally buy. Avoid buying items you wouldn't normally buy simply because they are on sale. Check online and in newspapers for  weekly deals. Buy healthy items from the bulk bins when available, such as herbs, spices, flour, pasta, nuts, and dried fruit. Buy fruits and vegetables that are in season. Prices are usually lower on in-season produce. Look at the unit price on the price tag. Use it to compare different brands and sizes to find out which item is the best deal. Choose healthy items that are often low-cost, such as carrots, potatoes, apples, bananas, and oranges. Dried or canned beans are a low-cost protein source. Buy in bulk and freeze extra food. Items you can buy in bulk include meats, fish, poultry, frozen fruits, and frozen vegetables. Avoid buying "ready-to-eat" foods, such as pre-cut fruits and vegetables and pre-made salads. If possible, shop around to discover where you can find the best prices. Consider other retailers such as dollar stores, larger wholesale stores, local fruit and vegetable stands, and farmers markets. Do not shop when you are hungry. If you shop while hungry, it may be hard to stick to your list and budget. Resist impulse buying. Use your grocery list as your official plan for the week. Buy a variety of vegetables and fruits by purchasing fresh, frozen, and canned items. Look at the top and bottom shelves for deals. Foods at eye level (eye level of an adult or child) are usually more expensive. Be efficient with your time when shopping. The more time you spend at the store, the more money you are likely to spend. To save money when choosing more expensive foods like meats and dairy: Choose cheaper cuts of meat, such as bone-in   chicken thighs and drumsticks instead of skinless and boneless chicken. When you are ready to prepare the chicken, you can remove the skin yourself to make it healthier. Choose lean meats like chicken or turkey instead of beef. Choose canned seafood, such as tuna, salmon, or sardines. Buy eggs as a low-cost source of protein. Buy dried beans and peas, such as  lentils, split peas, or kidney beans instead of meats. Dried beans and peas are a good alternative source of protein. Buy the larger tubs of yogurt instead of individual-sized containers. Choose water instead of sodas and other sweetened beverages. Avoid buying chips, cookies, and other "junk food." These items are usually expensive and not healthy.  Cooking Make extra food and freeze the extras in meal-sized containers or in individual portions for fast meals and snacks. Pre-cook on days when you have extra time to prepare meals in advance. You can keep these meals in the fridge or freezer and reheat for a quick meal. When you come home from the grocery store, wash, peel, and cut fruits and vegetables so they are ready to use and eat. This will help reduce food waste. Meal planning Do not eat out or get fast food. Prepare food at home. Make a grocery list and make sure to bring it with you to the store. If you have a smart phone, you could use your phone to create your shopping list. Plan meals and snacks according to a grocery list and budget you create. Use leftovers in your meal plan for the week. Look for recipes where you can cook once and make enough food for two meals. Prepare budget-friendly types of meals like stews, casseroles, and stir-fry dishes. Try some meatless meals or try "no cook" meals like salads. Make sure that half your plate is filled with fruits or vegetables. Choose from fresh, frozen, or canned fruits and vegetables. If eating canned, remember to rinse them before eating. This will remove any excess salt added for packaging. Summary Eating healthy on a budget is possible if you plan your meals according to your budget, purchase according to your budget and grocery list, and prepare food yourself. Tips for buying more food on a limited budget include buying generic brands, using coupons only for foods you normally buy, and buying healthy items from the bulk bins when  available. Tips for buying cheaper food to replace expensive food include choosing cheaper, lean cuts of meat, and buying dried beans and peas. This information is not intended to replace advice given to you by your health care provider. Make sure you discuss any questions you have with your healthcare provider. Document Revised: 08/06/2020 Document Reviewed: 08/06/2020 Elsevier Patient Education  2022 [employer] Inc. Bone Health Bones protect organs, store calcium, anchor muscles, and support the whole body. Keeping your bones strong is important, especially as you get older. Youcan take actions to help keep your bones strong and healthy. Why is keeping my bones healthy important?  Keeping your bones healthy is important because your body constantly replaces bone cells. Cells get old, and new cells take their place. As we age, we lose bone cells because the body may not be able to make enough new cells to replace the old cells. The amount of bone cells and bone tissue you have is referred toas bone mass. The higher your bone mass, the stronger your bones. The aging process leads to an overall loss of bone mass in the body, which can increase the likelihood of: Joint pain   and stiffness. Broken bones. A condition in which the bones become weak and brittle (osteoporosis). A large decline in bone mass occurs in older adults. In women, it occurs aboutthe time of menopause. What actions can I take to keep my bones healthy? Good health habits are important for maintaining healthy bones. This includes eating nutritious foods and exercising regularly. To have healthy bones, you need to get enough of the right minerals and vitamins. Most nutrition experts recommend getting these nutrients from the foods that you eat. In some cases, taking supplements may also be recommended. Doing certain types of exercise isalso important for bone health. What are the nutritional recommendations for healthy bones?  Eating  a well-balanced diet with plenty of calcium and vitamin D will help to protect your bones. Nutritional recommendations vary from person to person. Ask your health care provider what is healthy for you. Here are some generalguidelines. Get enough calcium Calcium is the most important (essential) mineral for bone health. Most people can get enough calcium from their diet, but supplements may be recommended for people who are at risk for osteoporosis. Good sources of calcium include: Dairy products, such as low-fat or nonfat milk, cheese, and yogurt. Dark green leafy vegetables, such as bok choy and broccoli. Calcium-fortified foods, such as orange juice, cereal, bread, soy beverages, and tofu products. Nuts, such as almonds. Follow these recommended amounts for daily calcium intake: Children, age 1-3: 700 mg. Children, age 4-8: 1,000 mg. Children, age 9-13: 1,300 mg. Teens, age 14-18: 1,300 mg. Adults, age 19-50: 1,000 mg. Adults, age 51-70: Men: 1,000 mg. Women: 1,200 mg. Adults, age 71 or older: 1,200 mg. Pregnant and breastfeeding females: Teens: 1,300 mg. Adults: 1,000 mg. Get enough vitamin D Vitamin D is the most essential vitamin for bone health. It helps the body absorb calcium. Sunlight stimulates the skin to make vitamin D, so be sure to get enough sunlight. If you live in a cold climate or you do not get outside often, your health care provider may recommend that you take vitamin D supplements. Good sources of vitamin D in your diet include: Egg yolks. Saltwater fish. Milk and cereal fortified with vitamin D. Follow these recommended amounts for daily vitamin D intake: Children and teens, age 1-18: 600 international units. Adults, age 50 or younger: 400-800 international units. Adults, age 51 or older: 800-1,000 international units. Get other important nutrients Other nutrients that are important for bone health include: Phosphorus. This mineral is found in meat, poultry,  dairy foods, nuts, and legumes. The recommended daily intake for adult men and adult women is 700 mg. Magnesium. This mineral is found in seeds, nuts, dark green vegetables, and legumes. The recommended daily intake for adult men is 400-420 mg. For adult women, it is 310-320 mg. Vitamin K. This vitamin is found in green leafy vegetables. The recommended daily intake is 120 mg for adult men and 90 mg for adult women. What type of physical activity is best for building and maintaining healthybones? Weight-bearing and strength-building activities are important for building and maintaining healthy bones. Weight-bearing activities cause muscles and bones to work against gravity. Strength-building activities increase the strength of the muscles that support bones. Weight-bearing and muscle-building activities include: Walking and hiking. Jogging and running. Dancing. Gym exercises. Lifting weights. Tennis and racquetball. Climbing stairs. Aerobics. Adults should get at least 30 minutes of moderate physical activity on most days. Children should get at least 60 minutes of moderate physical activity onmost days. Ask your health care   provider what type of exercise is best for you. How can I find out if my bone mass is low? Bone mass can be measured with an X-ray test called a bone mineral density (BMD) test. This test is recommended for all women who are age 65 or older. It may also be recommended for: Men who are age 70 or older. People who are at risk for osteoporosis because of: Having bones that break easily. Having a long-term disease that weakens bones, such as kidney disease or rheumatoid arthritis. Having menopause earlier than normal. Taking medicine that weakens bones, such as steroids, thyroid hormones, or hormone treatment for breast cancer or prostate cancer. Smoking. Drinking three or more alcoholic drinks a day. If you find that you have a low bone mass, you may be able to  preventosteoporosis or further bone loss by changing your diet and lifestyle. Where can I find more information? For more information, check out the following websites: National Osteoporosis Foundation: www.nof.org/patients National Institutes of Health: www.bones.nih.gov International Osteoporosis Foundation: www.iofbonehealth.org Summary The aging process leads to an overall loss of bone mass in the body, which can increase the likelihood of broken bones and osteoporosis. Eating a well-balanced diet with plenty of calcium and vitamin D will help to protect your bones. Weight-bearing and strength-building activities are also important for building and maintaining strong bones. Bone mass can be measured with an X-ray test called a bone mineral density (BMD) test. This information is not intended to replace advice given to you by your health care provider. Make sure you discuss any questions you have with your healthcare provider. Document Revised: 11/20/2017 Document Reviewed: 11/20/2017 Elsevier Patient Education  2022 [employer] Inc.  

## 2021-07-15 ENCOUNTER — Other Ambulatory Visit: Payer: Self-pay

## 2021-07-15 ENCOUNTER — Ambulatory Visit
Admission: RE | Admit: 2021-07-15 | Discharge: 2021-07-15 | Disposition: A | Discharge status: Home / Self Care | Payer: BC Managed Care – PPO | Source: Ambulatory Visit | Attending: Obstetrics and Gynecology | Admitting: Obstetrics and Gynecology

## 2021-07-15 DIAGNOSIS — Z1231 Encounter for screening mammogram for malignant neoplasm of breast: Secondary | ICD-10-CM | POA: Diagnosis present

## 2022-05-28 LAB — COLOGUARD: COLOGUARD: NEGATIVE

## 2022-06-30 ENCOUNTER — Other Ambulatory Visit: Payer: Self-pay

## 2022-06-30 DIAGNOSIS — Z1231 Encounter for screening mammogram for malignant neoplasm of breast: Secondary | ICD-10-CM

## 2022-07-26 ENCOUNTER — Ambulatory Visit
Admission: RE | Admit: 2022-07-26 | Discharge: 2022-07-26 | Disposition: A | Discharge status: Home / Self Care | Payer: BC Managed Care – PPO | Source: Ambulatory Visit

## 2022-07-26 DIAGNOSIS — Z1231 Encounter for screening mammogram for malignant neoplasm of breast: Secondary | ICD-10-CM | POA: Diagnosis not present

## 2023-03-08 NOTE — Telephone Encounter (Signed)
Error

## 2023-06-23 DIAGNOSIS — E7800 Pure hypercholesterolemia, unspecified: Secondary | ICD-10-CM | POA: Diagnosis not present

## 2023-06-23 DIAGNOSIS — Z79899 Other long term (current) drug therapy: Secondary | ICD-10-CM | POA: Diagnosis not present

## 2023-06-23 DIAGNOSIS — Z8739 Personal history of other diseases of the musculoskeletal system and connective tissue: Secondary | ICD-10-CM | POA: Diagnosis not present

## 2023-06-23 DIAGNOSIS — I10 Essential (primary) hypertension: Secondary | ICD-10-CM | POA: Diagnosis not present

## 2023-06-29 DIAGNOSIS — Z01411 Encounter for gynecological examination (general) (routine) with abnormal findings: Secondary | ICD-10-CM | POA: Diagnosis not present

## 2023-06-29 DIAGNOSIS — Z1331 Encounter for screening for depression: Secondary | ICD-10-CM | POA: Diagnosis not present

## 2023-06-29 DIAGNOSIS — Z124 Encounter for screening for malignant neoplasm of cervix: Secondary | ICD-10-CM | POA: Diagnosis not present

## 2023-06-29 DIAGNOSIS — Z1151 Encounter for screening for human papillomavirus (HPV): Secondary | ICD-10-CM | POA: Diagnosis not present

## 2023-06-29 DIAGNOSIS — R55 Syncope and collapse: Secondary | ICD-10-CM | POA: Diagnosis not present

## 2023-06-30 DIAGNOSIS — E7800 Pure hypercholesterolemia, unspecified: Secondary | ICD-10-CM | POA: Diagnosis not present

## 2023-06-30 DIAGNOSIS — Z0000 Encounter for general adult medical examination without abnormal findings: Secondary | ICD-10-CM | POA: Diagnosis not present

## 2023-06-30 DIAGNOSIS — R739 Hyperglycemia, unspecified: Secondary | ICD-10-CM | POA: Diagnosis not present

## 2023-06-30 DIAGNOSIS — Z79899 Other long term (current) drug therapy: Secondary | ICD-10-CM | POA: Diagnosis not present

## 2023-06-30 DIAGNOSIS — I10 Essential (primary) hypertension: Secondary | ICD-10-CM | POA: Diagnosis not present

## 2023-07-12 ENCOUNTER — Other Ambulatory Visit: Payer: Self-pay

## 2023-07-12 DIAGNOSIS — Z1231 Encounter for screening mammogram for malignant neoplasm of breast: Secondary | ICD-10-CM

## 2023-08-09 ENCOUNTER — Ambulatory Visit
Admission: RE | Admit: 2023-08-09 | Discharge: 2023-08-09 | Disposition: A | Discharge status: Home / Self Care | Payer: 59 | Source: Ambulatory Visit

## 2023-08-09 DIAGNOSIS — Z1231 Encounter for screening mammogram for malignant neoplasm of breast: Secondary | ICD-10-CM | POA: Diagnosis not present

## 2024-07-19 ENCOUNTER — Other Ambulatory Visit: Payer: Self-pay

## 2024-07-19 DIAGNOSIS — Z1231 Encounter for screening mammogram for malignant neoplasm of breast: Secondary | ICD-10-CM

## 2024-09-23 ENCOUNTER — Ambulatory Visit
Admission: RE | Admit: 2024-09-23 | Discharge: 2024-09-23 | Disposition: A | Discharge status: Home / Self Care | Source: Ambulatory Visit

## 2024-09-23 DIAGNOSIS — Z1231 Encounter for screening mammogram for malignant neoplasm of breast: Secondary | ICD-10-CM | POA: Diagnosis present
